# Patient Record
Sex: Male | Born: 1950 | Race: White | Hispanic: No | Marital: Married | Smoking: Never smoker
Health system: Southern US, Community
[De-identification: ages and names within clinical notes are randomized; demographics above are authoritative.]

## PROBLEM LIST (undated history)

## (undated) DIAGNOSIS — J45909 Unspecified asthma, uncomplicated: Secondary | ICD-10-CM

## (undated) DIAGNOSIS — M1990 Unspecified osteoarthritis, unspecified site: Secondary | ICD-10-CM

## (undated) DIAGNOSIS — K219 Gastro-esophageal reflux disease without esophagitis: Secondary | ICD-10-CM

## (undated) HISTORY — PX: JOINT REPLACEMENT: SHX530

## (undated) HISTORY — PX: KNEE CARTILAGE SURGERY: SHX688

---

## 2003-12-03 ENCOUNTER — Ambulatory Visit: Payer: Self-pay | Admitting: Internal Medicine

## 2004-04-13 ENCOUNTER — Ambulatory Visit: Payer: Self-pay | Admitting: Gastroenterology

## 2011-10-09 DIAGNOSIS — N138 Other obstructive and reflux uropathy: Secondary | ICD-10-CM | POA: Insufficient documentation

## 2011-10-09 DIAGNOSIS — N411 Chronic prostatitis: Secondary | ICD-10-CM | POA: Insufficient documentation

## 2014-01-06 ENCOUNTER — Ambulatory Visit: Payer: Self-pay | Admitting: General Practice

## 2014-01-06 LAB — URINALYSIS, COMPLETE
BACTERIA: NONE SEEN
BILIRUBIN, UR: NEGATIVE
BLOOD: NEGATIVE
Glucose,UR: NEGATIVE mg/dL (ref 0–75)
Ketone: NEGATIVE
Leukocyte Esterase: NEGATIVE
Nitrite: NEGATIVE
Ph: 6 (ref 4.5–8.0)
Protein: NEGATIVE
RBC,UR: <1 /HPF (ref 0–5)
Specific Gravity: 1.014 (ref 1.003–1.030)
WBC UR: <1 /HPF (ref 0–5)

## 2014-01-06 LAB — BASIC METABOLIC PANEL
Anion Gap: 5 — ABNORMAL LOW (ref 7–16)
BUN: 15 mg/dL (ref 7–18)
CHLORIDE: 105 mmol/L (ref 98–107)
CO2: 30 mmol/L (ref 21–32)
CREATININE: 0.94 mg/dL (ref 0.60–1.30)
Calcium, Total: 8.9 mg/dL (ref 8.5–10.1)
EGFR (African American): >60
EGFR (Non-African Amer.): >60
Glucose: 105 mg/dL — ABNORMAL HIGH (ref 65–99)
Osmolality: 281 (ref 275–301)
Potassium: 4.2 mmol/L (ref 3.5–5.1)
Sodium: 140 mmol/L (ref 136–145)

## 2014-01-06 LAB — CBC
HCT: 42.9 % (ref 40.0–52.0)
HGB: 14.4 g/dL (ref 13.0–18.0)
MCH: 30.2 pg (ref 26.0–34.0)
MCHC: 33.6 g/dL (ref 32.0–36.0)
MCV: 90 fL (ref 80–100)
PLATELETS: 215 10*3/uL (ref 150–440)
RBC: 4.77 10*6/uL (ref 4.40–5.90)
RDW: 13.1 % (ref 11.5–14.5)
WBC: 4.3 10*3/uL (ref 3.8–10.6)

## 2014-01-06 LAB — SEDIMENTATION RATE: Erythrocyte Sed Rate: 7 mm/hr (ref 0–20)

## 2014-01-06 LAB — MRSA PCR SCREENING

## 2014-01-06 LAB — PROTIME-INR
INR: 0.9
Prothrombin Time: 11.7 secs (ref 11.5–14.7)

## 2014-01-06 LAB — APTT: ACTIVATED PTT: 29.2 s (ref 23.6–35.9)

## 2014-01-07 LAB — URINE CULTURE

## 2014-01-18 ENCOUNTER — Inpatient Hospital Stay: Payer: Self-pay | Admitting: General Practice

## 2014-01-19 LAB — BASIC METABOLIC PANEL
ANION GAP: 6 — AB (ref 7–16)
BUN: 9 mg/dL (ref 7–18)
CALCIUM: 7.9 mg/dL — AB (ref 8.5–10.1)
CO2: 29 mmol/L (ref 21–32)
CREATININE: 0.95 mg/dL (ref 0.60–1.30)
Chloride: 102 mmol/L (ref 98–107)
EGFR (African American): >60
EGFR (Non-African Amer.): >60
GLUCOSE: 131 mg/dL — AB (ref 65–99)
Osmolality: 274 (ref 275–301)
Potassium: 3.5 mmol/L (ref 3.5–5.1)
SODIUM: 137 mmol/L (ref 136–145)

## 2014-01-19 LAB — HEMOGLOBIN: HGB: 11.6 g/dL — ABNORMAL LOW (ref 13.0–18.0)

## 2014-01-19 LAB — PLATELET COUNT: Platelet: 179 10*3/uL (ref 150–440)

## 2014-01-20 LAB — BASIC METABOLIC PANEL
Anion Gap: 5 — ABNORMAL LOW (ref 7–16)
BUN: 8 mg/dL (ref 7–18)
CO2: 29 mmol/L (ref 21–32)
Calcium, Total: 7.8 mg/dL — ABNORMAL LOW (ref 8.5–10.1)
Chloride: 104 mmol/L (ref 98–107)
Creatinine: 0.99 mg/dL (ref 0.60–1.30)
EGFR (African American): >60
EGFR (Non-African Amer.): >60
GLUCOSE: 134 mg/dL — AB (ref 65–99)
Osmolality: 276 (ref 275–301)
POTASSIUM: 3.8 mmol/L (ref 3.5–5.1)
Sodium: 138 mmol/L (ref 136–145)

## 2014-01-20 LAB — HEMOGLOBIN: HGB: 11 g/dL — ABNORMAL LOW (ref 13.0–18.0)

## 2014-01-20 LAB — PLATELET COUNT: PLATELETS: 174 10*3/uL (ref 150–440)

## 2014-02-02 DIAGNOSIS — Z96652 Presence of left artificial knee joint: Secondary | ICD-10-CM | POA: Insufficient documentation

## 2014-05-10 DIAGNOSIS — M1711 Unilateral primary osteoarthritis, right knee: Secondary | ICD-10-CM | POA: Insufficient documentation

## 2014-05-10 DIAGNOSIS — E785 Hyperlipidemia, unspecified: Secondary | ICD-10-CM | POA: Insufficient documentation

## 2014-05-10 DIAGNOSIS — K219 Gastro-esophageal reflux disease without esophagitis: Secondary | ICD-10-CM | POA: Insufficient documentation

## 2014-05-10 DIAGNOSIS — J45909 Unspecified asthma, uncomplicated: Secondary | ICD-10-CM | POA: Insufficient documentation

## 2014-05-22 NOTE — Op Note (Signed)
PATIENT NAME:  [name], [name] MR#:  [id number] DATE OF BIRTH:  [birth year]  DATE OF PROCEDURE:  01/18/2014  PREOPERATIVE DIAGNOSIS: Degenerative arthrosis of the left knee (primary).   POSTOPERATIVE DIAGNOSIS: Degenerative arthrosis of the left knee (primary).   PROCEDURE PERFORMED: Left total knee arthroplasty using computer-assisted navigation.   SURGEON: Francesco SorJames Hooten, MD   ASSISTANT: Van ClinesJon Wolfe, PA (required to maintain retraction throughout the procedure).   ANESTHESIA: Spinal.   ESTIMATED BLOOD LOSS: 50 mL.   FLUIDS REPLACED: Crystalloid 1700 mL.   TOURNIQUET TIME: 91 minutes.   DRAINS: 2 medium drains to reinfusion system.   SOFT TISSUE RELEASES: Anterior cruciate ligament, posterior cruciate ligament, deep medial collateral ligament, patellofemoral ligament.   IMPLANTS UTILIZED: DePuy PFC Sigma size 5 posterior stabilized femoral component (cemented), size 5 MBT tibial component (cemented), 38 mm 3 peg oval dome patella (cemented), and a 12.5 mm stabilized rotating platform polyethylene insert.   INDICATIONS FOR SURGERY: The patient is a 64 year old male who has been seen for complaints of progressive left knee pain. X-rays demonstrated severe degenerative changes in tricompartmental fashion with varus deformity. After discussion of the risks and benefits of surgical intervention, the patient expressed understanding of the risks and benefits, and agreed with plans for surgical intervention.   PROCEDURE IN DETAIL: The patient was brought into the operating room and, after adequate spinal anesthesia was achieved, a tourniquet was placed on the patient's upper left thigh. The patient's left knee and leg were cleaned and prepped with alcohol and DuraPrep, draped in the usual sterile fashion. A "timeout" was performed as per usual protocol. The left lower extremity was exsanguinated using an Esmarch, and the tourniquet was inflated to 300 mmHg. An anterior longitudinal incision was made  followed by a standard mid vastus approach. A large effusion was evacuated. The deep fibers of the medial collateral ligament were elevated in a subperiosteal fashion off the medial flare of the tibia so as to maintain a continuous soft tissue sleeve. The patella was subluxed laterally and the patellofemoral ligament was incised. Inspection of the knee demonstrated severe degenerative changes with full-thickness loss of articular cartilage to the medial compartment and significant degenerative changes to the lateral compartment and patellofemoral compartments. Prominent osteophytes were debrided using a rongeur. Anterior and posterior cruciate ligaments were excised. Two 4.0 mm Schanz pins were inserted into the femur and into the tibia for attachment of the ray of trackers used for computer-assisted navigation. Hip center was identified using circumduction technique. Distal landmarks were mapped using the computer. The distal femur and proximal tibia were mapped using the computer. Distal femoral cutting guide was positioned using computer-assisted navigation so as to achieve a 5 degree distal valgus cut. Cut was performed and verified using the computer. Distal femur was sized and it was felt that a size 5 femoral component was appropriate. A size 5 cutting guide was positioned and the anterior cut was performed and verified using the computer. This was followed by completion of the posterior and chamfer cuts. The cutting guide for the femoral box was positioned and femoral box cut was performed. Attention was then directed to the proximal tibia. Medial and lateral menisci were excised. The extramedullary tibial cutting guide was positioned using computer-assisted navigation so as to achieve a 0 degree varus valgus alignment, 0 degree posterior slope. A significant defect was noted to the medial compartment. Cut was performed and verified using the computer. The proximal tibia was sized and it was felt that a size  5 tibial tray was appropriate. Tibial and femoral trials were inserted followed by insertion of first a 10 and subsequently a 12.5 mm polyethylene insert. This allowed for excellent medial and lateral soft tissue balancing both in full extension and in flexion. Finally, the patella was cut and prepared so as to accommodate a 38 mm 3 peg oval dome patella. The patella component was positioned and it was placed through range of motion with good tracking of the patella.   Femoral trial was removed. Central post hole for the tibial component was reamed followed by insertion of a keel punch. Tibial trials were then removed. Cut surfaces of bone were irrigated with copious amounts of normal saline with antibiotic solution using pulsatile lavage then suctioned dry. Polymethylmethacrylate cement was prepared in the usual fashion using a vacuum mixer. Cement was applied to the cut surface of the proximal tibia as well as along the undersurface of a size 5 MBT tibial component. The tibial component was positioned and impacted into place. Excess cement was removed using Personal assistant. Cement was then applied to the cut surfaces of the femur as well as along the posterior flanges of a size 5 posterior stabilized femoral component. Femoral component was positioned and impacted into place. Excess cement was removed using Personal assistant. A 12.5 mm polyethylene trial was inserted and the knee was brought in full extension with steady axial compression applied. Finally, cement was applied to the backside of a 3 peg oval dome patella, and the patellar component was positioned and patellar clamp applied. Excess cement was removed using Personal assistant.   After adequate curing of cement, the tourniquet was deflated after a total tourniquet time of 91 minutes. Hemostasis was achieved using electrocautery. The knee was irrigated with copious amounts of normal saline with antibiotic solution using pulsatile lavage and then  suctioned dry. The knee was inspected for any residual cement debris. 20 mL of 1.3% Exparel and 40 mL of normal saline was injected along the posterior recess, medial and lateral gutters, and along the arthrotomy site. A 12.5 mm stabilized rotating platform polyethylene insert was inserted and the knee was placed through a range of motion with excellent patellar tracking appreciated and excellent medial and lateral soft tissue balancing noted. Two medium drains were placed in the wound bed and brought out through a separate stab incision to be attached to a reinfusion system. The medial parapatellar portion of the incision was reapproximated using interrupted sutures of #1 Vicryl. The subcutaneous tissue was approximated in layers using first #0 Vicryl followed by 2-0 Vicryl. Then, 30 mL of 0.25% Marcaine with epinephrine was injected into the subcutaneous tissue along the incision site. Skin was closed with skin staples. Sterile dressing was applied. The patient tolerated the procedure well. He was transported to the recovery room in stable condition.     ____________________________ Illene Labrador. Angie Fava., MD jph:TT D: 01/18/2014 13:02:11 ET T: 01/18/2014 14:31:45 ET JOB#: [id number]  cc: Fayrene Fearing P. Angie Fava., MD, <Dictator> Illene Labrador Angie Fava MD ELECTRONICALLY SIGNED 01/19/2014 19:49

## 2014-05-26 NOTE — Discharge Summary (Signed)
PATIENT NAME:  [name], [name] MR#:  [id number] DATE OF BIRTH:  04/15/1950  DATE OF ADMISSION:  01/18/2014 DATE OF DISCHARGE:  01/20/2014  ADMITTING DIAGNOSIS: Degenerative arthritis, left knee.   DISCHARGE DIAGNOSIS: Degenerative arthritis, left knee.   PROCEDURE PERFORMED: Left total knee arthroplasty with computer-assisted navigation.   SURGEON: Francesco SorJames Hooten, M.D.   ASSISTANT: Hamilton CapriJohn Wolfe, PA.   ANESTHESIA: Spinal.   ESTIMATED BLOOD LOSS: 50 mL.  FLUIDS REPLACED: Crystalloid 1700 mL.  TOURNIQUET TIME:  91 minutes.   DRAINS: Two medium drains to reinfusion system.   SOFT TISSUE RELEASES:  Anterior cruciate ligament, posterior cruciate ligament, deep medial collateral ligament and patellofemoral ligament.   IMPLANTS UTILIZED:  PFC Sigma size 5 posterior stabilized femoral component, size 5 MBT tibial component, 38 mm three peg oval dome patella, and a 12.5 mm stabilized rotating platform polyethylene insert.    HISTORY: The patient is a 64 year old male who has been here for complaints of progressive left knee pain. X-rays demonstrated severe degenerative changes in tricompartmental fashion with varus deformity. After discussion of the risks and benefits of surgical intervention, the patient expressed understanding of the risks and benefits and agreed with the plans for surgical intervention.   PHYSICAL EXAMINATION:  GENERAL: Well-developed, well-nourished male in no apparent distress. Normal affect. no antalgic component.  HEENT: Head normocephalic, atraumatic. Pupils equal, round, and reactive to light.  ABDOMEN: Soft, nontender, nondistended. Bowel sounds positive.  HEART: Regular rate and rhythm.  LUNGS: Clear to auscultation bilaterally. No wheezing, rales or rhonchi.   LEFT KNEE EXAMINATION:  Left knee shows the patient has no effusion, and no warmth or erythema. There is mild swelling. He is tender along the medial and lateral joint line. He has a slight varus deformity. He  is neurovascularly intact in the left lower extremity.   HOSPITAL COURSE: The patient was admitted to the hospital on 01/18/2014. He had surgery that same day and was brought to the orthopedic floor from the PACU in stable condition. On postop day one, the patient had an acute postoperative blood loss anemia with hemoglobin of 11.6. On postop day two, this trended down to 11. The patient was doing well. He was stable.  The remainder of the labs were stable. Vital signs are stable. He made excellent progress with physical therapy and by postop day two, he was stable and ready for discharge to home with home health PT.   CONDITION AT DISCHARGE: Stable.   DISCHARGE INSTRUCTIONS: The patient may gradually increase weight-bearing on the affected extremity. Elevate the affected foot and leg with the foot higher than the knee; elevate the heels off the bed. Use incentive spirometer every hour while awake. Encouraged cough and deep breathing. The patient may resume a regular diet as tolerated. Continue using Polar Care unit, maintain temperature between 40 and 50 degrees, do not get the dressing or bandage wet or dirty. [facility] if the dressing gets water under it. Leave the dressing on and call [facility] if any of the following occur: bright red blood from incision wound, fever above 101.5 degrees, redness, swelling, or drainage at the incision site. Call [facility] if you experience any increased leg pain, numbness or weakness in her legs or bowel or bladder symptoms. Home physical therapy has been arranged for the rehab program. Please [facility] if a therapist has not contacted you within 48 hours of your return home.   DISCHARGE MEDICATIONS: Please see list of discharge medications on discharge  instructions.    ____________________________ T. Cranston Neighbor, PA-C tcg:at D: 01/21/2014 08:16:10 ET T: 01/21/2014 10:16:48 ET JOB#: [id number]  cc: T. Cranston Neighbor, PA-C,  <Dictator> Evon Slack [place] ELECTRONICALLY SIGNED 02/09/2014 8:33

## 2015-04-29 ENCOUNTER — Other Ambulatory Visit
Admission: RE | Admit: 2015-04-29 | Discharge: 2015-04-29 | Disposition: A | Discharge status: Home / Self Care | Payer: Medicare Other | Source: Ambulatory Visit | Attending: Internal Medicine | Admitting: Internal Medicine

## 2015-04-29 DIAGNOSIS — R197 Diarrhea, unspecified: Secondary | ICD-10-CM | POA: Insufficient documentation

## 2015-04-29 LAB — C DIFFICILE QUICK SCREEN W PCR REFLEX
C DIFFICLE (CDIFF) ANTIGEN: NEGATIVE
C Diff interpretation: NEGATIVE
C Diff toxin: NEGATIVE

## 2016-11-20 DIAGNOSIS — R339 Retention of urine, unspecified: Secondary | ICD-10-CM | POA: Insufficient documentation

## 2017-02-08 DIAGNOSIS — G629 Polyneuropathy, unspecified: Secondary | ICD-10-CM | POA: Insufficient documentation

## 2017-02-08 DIAGNOSIS — G5603 Carpal tunnel syndrome, bilateral upper limbs: Secondary | ICD-10-CM | POA: Insufficient documentation

## 2017-03-06 DIAGNOSIS — M7521 Bicipital tendinitis, right shoulder: Secondary | ICD-10-CM | POA: Insufficient documentation

## 2017-03-06 DIAGNOSIS — G5621 Lesion of ulnar nerve, right upper limb: Secondary | ICD-10-CM | POA: Insufficient documentation

## 2017-03-26 ENCOUNTER — Other Ambulatory Visit: Payer: Self-pay | Admitting: Surgery

## 2017-03-26 DIAGNOSIS — M7521 Bicipital tendinitis, right shoulder: Secondary | ICD-10-CM

## 2017-04-02 ENCOUNTER — Other Ambulatory Visit: Payer: Self-pay | Admitting: Surgery

## 2017-04-02 ENCOUNTER — Other Ambulatory Visit: Payer: Self-pay | Admitting: Orthopedic Surgery

## 2017-04-02 DIAGNOSIS — Z1389 Encounter for screening for other disorder: Secondary | ICD-10-CM

## 2017-04-02 DIAGNOSIS — M795 Residual foreign body in soft tissue: Secondary | ICD-10-CM

## 2017-04-03 ENCOUNTER — Ambulatory Visit
Admission: RE | Admit: 2017-04-03 | Discharge: 2017-04-03 | Disposition: A | Discharge status: Home / Self Care | Payer: Medicare Other | Source: Ambulatory Visit | Attending: Surgery | Admitting: Surgery

## 2017-04-03 DIAGNOSIS — M7521 Bicipital tendinitis, right shoulder: Secondary | ICD-10-CM | POA: Insufficient documentation

## 2017-04-03 DIAGNOSIS — M19011 Primary osteoarthritis, right shoulder: Secondary | ICD-10-CM | POA: Insufficient documentation

## 2017-04-03 DIAGNOSIS — Z1389 Encounter for screening for other disorder: Secondary | ICD-10-CM

## 2017-04-03 DIAGNOSIS — M795 Residual foreign body in soft tissue: Secondary | ICD-10-CM | POA: Diagnosis not present

## 2017-04-18 DIAGNOSIS — M7581 Other shoulder lesions, right shoulder: Secondary | ICD-10-CM | POA: Insufficient documentation

## 2019-01-08 NOTE — H&P (Signed)
ORTHOPAEDIC HISTORY & PHYSICAL  Progress Notes by Gwenlyn Fudge, PA at 01/08/2019 8:45 AM  [place] [facility] Chief Complaint:       Chief Complaint  Patient presents with  . Knee Pain    H & P RIGHT KNEE    History of Present Illness:    [name] is a 68 y.o. male that presents to clinic today for his preoperative history and evaluation.  Patient presents unaccompanied. The patient is scheduled to undergo a right total knee arthroplasty on 01/21/19 by Dr. Marry Guan. His pain began many years ago.  The pain is located throughout the knee.  He reports associated increase in pain with weightbearing as well as some giving way of the knee.  He states that the pain in the right knee has been worse since he fell 1 year ago while getting out of his tractor and that his knee flexed further than it normally would.  He states his range of motion has actually improved since the fall, but that the pain has as well.  He denies associated numbness or tingling or locking of the knee.  The patient's symptoms have progressed to the point that they decrease his quality of life. The patient has previously undergone conservative treatment including NSAIDS and injections to the knee without adequate control of his symptoms.  Of note, patient is status post left total knee arthroplasty by Dr. Marry Guan on 01/18/2014.  Patient states the left knee is doing well.  Patient denies history of blood clots or significant cardiac history.  Past Medical, Surgical, Family, Social History, Allergies, Medications:   Past Medical History:      Past Medical History:  Diagnosis Date  . Asthma without status asthmaticus, unspecified    mild  . Hay fever   . Hypercholesterolemia    diet  . Osteoarthritis    knees    Past Surgical History:       Past Surgical History:  Procedure Laterality Date  . CARTILAGE SURGERY ON BOTH KNEES Bilateral   .  COLONOSCOPY  04/14/2018   Inflammatory Polyp/Repeat 73yrs/TKT  . EGD  04/14/2018   Fundic Gland Polyp/No Repeat/TKT  . Left total knee arthroplasty using computer-assisted navigation  01/18/2014   Dr Marry Guan    Current Medications:  Current Medications        Current Outpatient Medications  Medication Sig Dispense Refill  . albuterol 90 mcg/actuation inhaler Inhale 2 inhalations into the lungs every 4 (four) hours as needed for Wheezing 1 Inhaler 11  . amoxicillin (AMOXIL) 500 MG capsule TK 4 CS PO 1 HOUR PRIOR TO PROCEDURE    . cannabidiol, CBD, (CANNABIDIOL ORAL) Take 1 tablet by mouth once daily       . esomeprazole (NEXIUM) 40 MG DR capsule TAKE ONE CAPSULE BY MOUTH ONCE DAILY 90 capsule 3  . fluticasone propion-salmeteroL (ADVAIR DISKUS) 250-50 mcg/dose diskus inhaler Inhale 1 inhalation into the lungs every 12 (twelve) hours 180 each 3  . fluticasone propionate (FLONASE) 50 mcg/actuation nasal spray SPRAY TWICE IN EACH NOSTRIL BID    . Lacto.acidophilus-Bif.animalis 32 billion cell Cap Take 1 capsule by mouth once daily       . Lactobacillus acidophilus (PROBIOTIC ORAL) Take 1 capsule by mouth once daily    . meloxicam (MOBIC) 7.5 MG tablet TAKE 1 TABLET BY MOUTH EVERY DAY 90 tablet 1  . montelukast (SINGULAIR) 10 mg tablet TAKE 1 TABLET BY MOUTH EVERY DAY 90 tablet 1  .  tamsulosin (FLOMAX) 0.4 mg capsule Take 1 capsule (0.4 mg total) by mouth once daily 89 capsule 3  . TURMERIC ORAL Take 500 mg by mouth once daily     No current facility-administered medications for this visit.       Allergies: No Known Allergies  Social History:  Social History  Social History        Socioeconomic History  . Marital status: Married    Spouse name: Eunice BlaseDebbie  . Number of children: 2  . Years of education: 3812  . Highest education level: High school graduate  Occupational History  . Occupation: Full-time- Visual merchandiserarmer  Social Needs  . Financial resource strain: Not  on file  . Food insecurity    Worry: Not on file    Inability: Not on file  . Transportation needs    Medical: Not on file    Non-medical: Not on file  Tobacco Use  . Smoking status: Never Smoker  . Smokeless tobacco: Never Used  Substance and Sexual Activity  . Alcohol use: No    Alcohol/week: 0.0 standard drinks  . Drug use: No  . Sexual activity: Yes    Partners: Female  Lifestyle  . Physical activity    Days per week: Not on file    Minutes per session: Not on file  . Stress: Not on file  Relationships  . Social Musicianconnections    Talks on phone: Not on file    Gets together: Not on file    Attends religious service: Not on file    Active member of club or organization: Not on file    Attends meetings of clubs or organizations: Not on file    Relationship status: Not on file  Other Topics Concern  . Not on file  Social History Narrative  . Not on file      Family History:       Family History  Problem Relation Age of Onset  . Hepatitis Father   . Cirrhosis Father        no alcohol was involved  . Diabetes type II Sister   . Diabetes Paternal Grandmother   . Diabetes Paternal Grandfather     Review of Systems:   A 10+ ROS was performed, reviewed, and the pertinent orthopaedic findings are documented in the HPI.    Physical Examination:   BP 128/76   Ht 172.7 cm (5\' 8" )   Wt 76.6 kg (168 lb 12.8 oz)   BMI 25.67 kg/m   Patient is a well-developed, well-nourished male in no acute distress. Patient has normal mood and affect. Patient is alert and oriented to person, place, and time.   HEENT: Atraumatic, normocephalic.  Pupils equal and reactive to light.  Extraocular motion intact.  Noninjected sclera.  Cardiovascular: Regular rate and rhythm, with no murmurs, rubs, or gallops.  Distal pulses palpable.  Respiratory: Lungs clear to auscultation bilaterally.   RightKnee: Soft tissue  swelling:minimal Effusion:none Erythema:none Crepitance:mild Tenderness:medial Alignment:relative varus Mediolateral laxity:medial pseudolaxity Posterior ZOX:WRUEAVWUsag:negative Patellar tracking:Good tracking without evidence of subluxation or tilt Atrophy:No significantatrophy.  Quadriceps tone was good. Range of motion:0/5/115 degrees  Sensation intact over the saphenous, lateral sural cutaneous, superficial fibular, and deep fibular nerve distributions.  Tests Performed/Reviewed:  X-rays  Anteroposterior, lateral, and sunrise views of the right knee were obtained.  Images reveal complete loss of both medial and lateral compartment joint spaces with deformation of the plateau as well as of the medial and lateral condyles.  Subchondral sclerosis noted  laterally.  Significant osteophyte formation noted.  Sunrise view reveals loss of patellofemoral joint space with osteophyte formation.  Sunrise view also reveals presence of a left total knee arthroplasty.  Radiographs were ordered and interpreted personally by me.  Impression:     ICD-10-CM  1. Primary osteoarthritis of right knee  M17.11  2. Presence of left artificial knee joint  [id number]   Plan:   The patient has end-stage degenerative changes of the right knee.  It was explained to the patient that the condition is progressive in nature.  Having failed conservative treatment, the patient has elected to proceed with a total joint arthroplasty.  The patient will undergo a total joint arthroplasty with Dr. Ernest Pine.  The risks of surgery, including blood clot and infection, were discussed with the patient.  Measures to reduce these risks, including the use of anticoagulation, perioperative antibiotics, and early ambulation were discussed.  The  importance of postoperative physical therapy was discussed with the patient. The patient elects to proceed with surgery. The patient is instructed to stop all blood thinners prior to surgery.  The patient is instructed to call the hospital the day before surgery to learn of the proper arrival time.    Contact our office with any questions or concerns.  Follow up as indicated, or sooner should any new problems arise, if conditions worsen, or if they are otherwise concerned.   Michelene Gardener, PA [facility] [address] [place], [place] [address] Phone: [phone]  This note was generated in part with voice recognition software and I apologize for any typographical errors that were not detected and corrected.     Electronically signed by Michelene Gardener, PA on 01/08/2019 10:17 AM

## 2019-01-09 ENCOUNTER — Encounter
Admission: RE | Admit: 2019-01-09 | Discharge: 2019-01-09 | Disposition: A | Discharge status: Home / Self Care | Payer: Medicare Other | Source: Ambulatory Visit | Attending: Orthopedic Surgery | Admitting: Orthopedic Surgery

## 2019-01-09 ENCOUNTER — Other Ambulatory Visit: Payer: Self-pay

## 2019-01-09 HISTORY — DX: Unspecified osteoarthritis, unspecified site: M19.90

## 2019-01-09 HISTORY — DX: Unspecified asthma, uncomplicated: J45.909

## 2019-01-09 HISTORY — DX: Gastro-esophageal reflux disease without esophagitis: K21.9

## 2019-01-09 NOTE — Patient Instructions (Signed)
Your procedure is scheduled on: 01/21/19 Report to [place]. To find out your arrival time please call [phone] between 1PM - 3PM on 01/20/19.  Remember: Instructions that are not followed completely may result in serious medical risk, up to and including death, or upon the discretion of your surgeon and anesthesiologist your surgery may need to be rescheduled.     _X__ 1. Do not eat food after midnight the night before your procedure.                 No gum chewing or hard candies. You may drink clear liquids up to 2 hours                 before you are scheduled to arrive for your surgery- DO not drink clear                 liquids within 2 hours of the start of your surgery.                 Clear Liquids include:  water, apple juice without pulp, clear carbohydrate                 drink such as Clearfast or Gatorade, Black Coffee or Tea (Do not add                 anything to coffee or tea). Diabetics water only  __X__2.  On the morning of surgery brush your teeth with toothpaste and water, you                 may rinse your mouth with mouthwash if you wish.  Do not swallow any              toothpaste of mouthwash.     _X__ 3.  No Alcohol for 24 hours before or after surgery.   _X__ 4.  Do Not Smoke or use e-cigarettes For 24 Hours Prior to Your Surgery.                 Do not use any chewable tobacco products for at least 6 hours prior to                 surgery.  ____  5.  Bring all medications with you on the day of surgery if instructed.   __X__  6.  Notify your doctor if there is any change in your medical condition      (cold, fever, infections).     Do not wear jewelry, make-up, hairpins, clips or nail polish. Do not wear lotions, powders, or perfumes.  Do not shave 48 hours prior to surgery. Men may shave face and neck. Do not bring valuables to the hospital.    [facility] is not responsible for any belongings  or valuables.  Contacts, dentures/partials or body piercings may not be worn into surgery. Bring a case for your contacts, glasses or hearing aids, a denture cup will be supplied. Leave your suitcase in the car. After surgery it may be brought to your room. For patients admitted to the hospital, discharge time is determined by your treatment team.   Patients discharged the day of surgery will not be allowed to drive home.   Please read over the following fact sheets that you were given:   MRSA Information  __X__ Take these medicines the morning of surgery with A SIP OF WATER:  1. esomeprazole (NEXIUM) 40 MG capsule  2. montelukast (SINGULAIR) 10 MG tablet  3. tamsulosin (FLOMAX) 0.4 MG CAPS capsule  4.  5.  6.  ____ Fleet Enema (as directed)   __X__ Use CHG Soap/SAGE wipes as directed  __X__ Use inhalers on the day of surgery  ____ Stop metformin/Janumet/Farxiga 2 days prior to surgery    ____ Take 1/2 of usual insulin dose the night before surgery. No insulin the morning          of surgery.   ____ Stop Blood Thinners Coumadin/Plavix/Xarelto/Pleta/Pradaxa/Eliquis/Effient/Aspirin  on   Or contact your Surgeon, Cardiologist or Medical Doctor regarding  ability to stop your blood thinners  __X__ Stop Anti-inflammatories 7 days before surgery such as Advil, Ibuprofen, Motrin,  BC or Goodies Powder, Naprosyn, Naproxen, Aleve, Aspirin   STOP MELOXICAM  __X__ Stop all herbal supplements, fish oil or vitamin E until after surgery. STOP TURMERIC   ____ Bring C-Pap to the hospital.

## 2019-01-12 ENCOUNTER — Other Ambulatory Visit: Payer: Self-pay

## 2019-01-12 ENCOUNTER — Encounter
Admission: RE | Admit: 2019-01-12 | Discharge: 2019-01-12 | Disposition: A | Discharge status: Home / Self Care | Payer: Medicare Other | Source: Ambulatory Visit | Attending: Orthopedic Surgery | Admitting: Orthopedic Surgery

## 2019-01-12 DIAGNOSIS — R001 Bradycardia, unspecified: Secondary | ICD-10-CM | POA: Insufficient documentation

## 2019-01-12 DIAGNOSIS — Z01812 Encounter for preprocedural laboratory examination: Secondary | ICD-10-CM | POA: Diagnosis not present

## 2019-01-12 LAB — CBC
HCT: 40.8 % (ref 39.0–52.0)
Hemoglobin: 14.3 g/dL (ref 13.0–17.0)
MCH: 29.5 pg (ref 26.0–34.0)
MCHC: 35 g/dL (ref 30.0–36.0)
MCV: 84.3 fL (ref 80.0–100.0)
Platelets: 221 10*3/uL (ref 150–400)
RBC: 4.84 MIL/uL (ref 4.22–5.81)
RDW: 12.4 % (ref 11.5–15.5)
WBC: 5.3 10*3/uL (ref 4.0–10.5)
nRBC: 0 % (ref 0.0–0.2)

## 2019-01-12 LAB — COMPREHENSIVE METABOLIC PANEL
ALT: 24 U/L (ref 0–44)
AST: 23 U/L (ref 15–41)
Albumin: 4.1 g/dL (ref 3.5–5.0)
Alkaline Phosphatase: 75 U/L (ref 38–126)
Anion gap: 7 (ref 5–15)
BUN: 19 mg/dL (ref 8–23)
CO2: 26 mmol/L (ref 22–32)
Calcium: 8.9 mg/dL (ref 8.9–10.3)
Chloride: 106 mmol/L (ref 98–111)
Creatinine, Ser: 0.9 mg/dL (ref 0.61–1.24)
GFR calc Af Amer: >60 mL/min (ref >60)
GFR calc non Af Amer: >60 mL/min (ref >60)
Glucose, Bld: 111 mg/dL — ABNORMAL HIGH (ref 70–99)
Potassium: 4 mmol/L (ref 3.5–5.1)
Sodium: 139 mmol/L (ref 135–145)
Total Bilirubin: 0.9 mg/dL (ref 0.3–1.2)
Total Protein: 7 g/dL (ref 6.5–8.1)

## 2019-01-12 LAB — URINALYSIS, ROUTINE W REFLEX MICROSCOPIC
Bacteria, UA: NONE SEEN
Bilirubin Urine: NEGATIVE
Glucose, UA: NEGATIVE mg/dL
Hgb urine dipstick: NEGATIVE
Ketones, ur: NEGATIVE mg/dL
Leukocytes,Ua: NEGATIVE
Nitrite: NEGATIVE
Protein, ur: NEGATIVE mg/dL
Specific Gravity, Urine: 1.015 (ref 1.005–1.030)
pH: 7 (ref 5.0–8.0)

## 2019-01-12 LAB — PROTIME-INR
INR: 0.9 (ref 0.8–1.2)
Prothrombin Time: 12.4 seconds (ref 11.4–15.2)

## 2019-01-12 LAB — APTT: aPTT: 31 seconds (ref 24–36)

## 2019-01-12 LAB — TYPE AND SCREEN
ABO/RH(D): A POS
Antibody Screen: NEGATIVE

## 2019-01-12 LAB — SURGICAL PCR SCREEN
MRSA, PCR: NEGATIVE
Staphylococcus aureus: NEGATIVE

## 2019-01-12 LAB — SEDIMENTATION RATE: Sed Rate: 5 mm/hr (ref 0–20)

## 2019-01-12 LAB — C-REACTIVE PROTEIN: CRP: 0.5 mg/dL (ref <1.0)

## 2019-01-13 LAB — URINE CULTURE
Culture: NO GROWTH
Special Requests: NORMAL

## 2019-01-16 ENCOUNTER — Other Ambulatory Visit
Admission: RE | Admit: 2019-01-16 | Discharge: 2019-01-16 | Disposition: A | Discharge status: Home / Self Care | Payer: Medicare Other | Source: Ambulatory Visit | Attending: Orthopedic Surgery | Admitting: Orthopedic Surgery

## 2019-01-16 DIAGNOSIS — Z01812 Encounter for preprocedural laboratory examination: Secondary | ICD-10-CM | POA: Insufficient documentation

## 2019-01-16 DIAGNOSIS — Z20828 Contact with and (suspected) exposure to other viral communicable diseases: Secondary | ICD-10-CM | POA: Insufficient documentation

## 2019-01-16 LAB — SARS CORONAVIRUS 2 (TAT 6-24 HRS): SARS Coronavirus 2: NEGATIVE

## 2019-01-20 ENCOUNTER — Encounter: Payer: Self-pay | Admitting: Orthopedic Surgery

## 2019-01-20 MED ORDER — TRANEXAMIC ACID-NACL 1000-0.7 MG/100ML-% IV SOLN
1000.0000 mg | INTRAVENOUS | Status: AC
  Administered 2019-01-21: 1000 mg via INTRAVENOUS

## 2019-01-21 ENCOUNTER — Ambulatory Visit
Admission: RE | Admit: 2019-01-21 | Discharge: 2019-01-22 | Disposition: A | Discharge status: Home / Self Care | Payer: Medicare Other | Attending: Orthopedic Surgery | Admitting: Orthopedic Surgery

## 2019-01-21 ENCOUNTER — Ambulatory Visit: Payer: Medicare Other | Admitting: Anesthesiology

## 2019-01-21 ENCOUNTER — Other Ambulatory Visit: Payer: Self-pay

## 2019-01-21 ENCOUNTER — Encounter: Payer: Self-pay | Admitting: Orthopedic Surgery

## 2019-01-21 ENCOUNTER — Ambulatory Visit: Payer: Medicare Other

## 2019-01-21 ENCOUNTER — Encounter
Admission: RE | Disposition: A | Discharge status: Home / Self Care | Payer: Self-pay | Source: Home / Self Care | Attending: Orthopedic Surgery

## 2019-01-21 DIAGNOSIS — Z96652 Presence of left artificial knee joint: Secondary | ICD-10-CM | POA: Diagnosis not present

## 2019-01-21 DIAGNOSIS — Z79899 Other long term (current) drug therapy: Secondary | ICD-10-CM | POA: Insufficient documentation

## 2019-01-21 DIAGNOSIS — G629 Polyneuropathy, unspecified: Secondary | ICD-10-CM | POA: Diagnosis not present

## 2019-01-21 DIAGNOSIS — J45909 Unspecified asthma, uncomplicated: Secondary | ICD-10-CM | POA: Diagnosis not present

## 2019-01-21 DIAGNOSIS — Z791 Long term (current) use of non-steroidal anti-inflammatories (NSAID): Secondary | ICD-10-CM | POA: Insufficient documentation

## 2019-01-21 DIAGNOSIS — E7800 Pure hypercholesterolemia, unspecified: Secondary | ICD-10-CM | POA: Diagnosis not present

## 2019-01-21 DIAGNOSIS — G5603 Carpal tunnel syndrome, bilateral upper limbs: Secondary | ICD-10-CM | POA: Insufficient documentation

## 2019-01-21 DIAGNOSIS — K219 Gastro-esophageal reflux disease without esophagitis: Secondary | ICD-10-CM | POA: Diagnosis not present

## 2019-01-21 DIAGNOSIS — M6281 Muscle weakness (generalized): Secondary | ICD-10-CM | POA: Insufficient documentation

## 2019-01-21 DIAGNOSIS — E785 Hyperlipidemia, unspecified: Secondary | ICD-10-CM | POA: Diagnosis not present

## 2019-01-21 DIAGNOSIS — R2689 Other abnormalities of gait and mobility: Secondary | ICD-10-CM | POA: Insufficient documentation

## 2019-01-21 DIAGNOSIS — Z7951 Long term (current) use of inhaled steroids: Secondary | ICD-10-CM | POA: Insufficient documentation

## 2019-01-21 DIAGNOSIS — Z96651 Presence of right artificial knee joint: Secondary | ICD-10-CM

## 2019-01-21 DIAGNOSIS — M1711 Unilateral primary osteoarthritis, right knee: Secondary | ICD-10-CM | POA: Insufficient documentation

## 2019-01-21 DIAGNOSIS — Z96659 Presence of unspecified artificial knee joint: Secondary | ICD-10-CM

## 2019-01-21 HISTORY — PX: KNEE ARTHROPLASTY: SHX992

## 2019-01-21 LAB — ABO/RH: ABO/RH(D): A POS

## 2019-01-21 SURGERY — ARTHROPLASTY, KNEE, TOTAL, USING IMAGELESS COMPUTER-ASSISTED NAVIGATION
Anesthesia: Spinal | Site: Knee | Laterality: Right

## 2019-01-21 MED ORDER — GABAPENTIN 300 MG PO CAPS
ORAL_CAPSULE | ORAL | Status: AC
  Filled 2019-01-21: qty 1

## 2019-01-21 MED ORDER — CELECOXIB 200 MG PO CAPS: 400.0000 mg | ORAL_CAPSULE | Freq: Once | ORAL | Status: AC

## 2019-01-21 MED ORDER — FLUTICASONE PROPIONATE 50 MCG/ACT NA SUSP
2.0000 | Freq: Every day | NASAL | Status: DC | PRN
  Filled 2019-01-21: qty 16

## 2019-01-21 MED ORDER — MOMETASONE FURO-FORMOTEROL FUM 200-5 MCG/ACT IN AERO
2.0000 | INHALATION_SPRAY | Freq: Two times a day (BID) | RESPIRATORY_TRACT | Status: DC
  Filled 2019-01-21: qty 8.8

## 2019-01-21 MED ORDER — BUPIVACAINE LIPOSOME 1.3 % IJ SUSP
INTRAMUSCULAR | Status: AC
  Filled 2019-01-21: qty 20

## 2019-01-21 MED ORDER — NEOMYCIN-POLYMYXIN B GU 40-200000 IR SOLN
Status: AC
  Filled 2019-01-21: qty 20

## 2019-01-21 MED ORDER — MENTHOL 3 MG MT LOZG
1.0000 | LOZENGE | OROMUCOSAL | Status: DC | PRN
  Filled 2019-01-21: qty 9

## 2019-01-21 MED ORDER — SENNOSIDES-DOCUSATE SODIUM 8.6-50 MG PO TABS
1.0000 | ORAL_TABLET | Freq: Two times a day (BID) | ORAL | Status: DC
  Administered 2019-01-21 – 2019-01-22 (×3): 1 via ORAL
  Filled 2019-01-21 (×3): qty 1

## 2019-01-21 MED ORDER — PROPOFOL 500 MG/50ML IV EMUL
INTRAVENOUS | Status: AC
  Filled 2019-01-21: qty 50

## 2019-01-21 MED ORDER — TAMSULOSIN HCL 0.4 MG PO CAPS
0.4000 mg | ORAL_CAPSULE | Freq: Every day | ORAL | Status: DC
  Administered 2019-01-22: 0.4 mg via ORAL
  Filled 2019-01-21 (×2): qty 1

## 2019-01-21 MED ORDER — TRANEXAMIC ACID-NACL 1000-0.7 MG/100ML-% IV SOLN: 1000.0000 mg | Freq: Once | INTRAVENOUS | Status: DC

## 2019-01-21 MED ORDER — SODIUM CHLORIDE 0.9 % IV SOLN
INTRAVENOUS | Status: DC | PRN
  Administered 2019-01-21: 20 ug/min via INTRAVENOUS

## 2019-01-21 MED ORDER — FENTANYL CITRATE (PF) 100 MCG/2ML IJ SOLN: 25.0000 ug | INTRAMUSCULAR | Status: DC | PRN

## 2019-01-21 MED ORDER — BUPIVACAINE HCL (PF) 0.25 % IJ SOLN
INTRAMUSCULAR | Status: DC | PRN
  Administered 2019-01-21: 60 mL

## 2019-01-21 MED ORDER — EPHEDRINE SULFATE 50 MG/ML IJ SOLN
INTRAMUSCULAR | Status: AC
  Filled 2019-01-21: qty 1

## 2019-01-21 MED ORDER — LIDOCAINE HCL (PF) 2 % IJ SOLN
INTRAMUSCULAR | Status: AC
  Filled 2019-01-21: qty 10

## 2019-01-21 MED ORDER — METOCLOPRAMIDE HCL 5 MG/ML IJ SOLN: 5.0000 mg | Freq: Three times a day (TID) | INTRAMUSCULAR | Status: DC | PRN

## 2019-01-21 MED ORDER — TRANEXAMIC ACID-NACL 1000-0.7 MG/100ML-% IV SOLN
INTRAVENOUS | Status: AC
  Filled 2019-01-21: qty 100

## 2019-01-21 MED ORDER — ALUM & MAG HYDROXIDE-SIMETH 200-200-20 MG/5ML PO SUSP: 30.0000 mL | ORAL | Status: DC | PRN

## 2019-01-21 MED ORDER — NEOMYCIN-POLYMYXIN B GU 40-200000 IR SOLN
Status: DC | PRN
  Administered 2019-01-21: 14 mL

## 2019-01-21 MED ORDER — BISACODYL 10 MG RE SUPP
10.0000 mg | Freq: Every day | RECTAL | Status: DC | PRN
  Filled 2019-01-21: qty 1

## 2019-01-21 MED ORDER — METOCLOPRAMIDE HCL 10 MG PO TABS
10.0000 mg | ORAL_TABLET | Freq: Three times a day (TID) | ORAL | Status: DC
  Administered 2019-01-21 – 2019-01-22 (×3): 10 mg via ORAL
  Filled 2019-01-21 (×3): qty 1

## 2019-01-21 MED ORDER — SODIUM CHLORIDE 0.9 % IV BOLUS
500.0000 mL | Freq: Once | INTRAVENOUS | Status: AC
  Administered 2019-01-21: 500 mL via INTRAVENOUS

## 2019-01-21 MED ORDER — CEFAZOLIN SODIUM-DEXTROSE 2-4 GM/100ML-% IV SOLN
INTRAVENOUS | Status: AC
  Administered 2019-01-21: 2 g via INTRAVENOUS
  Filled 2019-01-21: qty 100

## 2019-01-21 MED ORDER — BUPIVACAINE HCL (PF) 0.5 % IJ SOLN
INTRAMUSCULAR | Status: AC
  Filled 2019-01-21: qty 10

## 2019-01-21 MED ORDER — OXYCODONE HCL 5 MG PO TABS
ORAL_TABLET | ORAL | Status: AC
  Administered 2019-01-21: 10 mg via ORAL
  Filled 2019-01-21: qty 2

## 2019-01-21 MED ORDER — MONTELUKAST SODIUM 10 MG PO TABS
10.0000 mg | ORAL_TABLET | Freq: Every day | ORAL | Status: DC
  Administered 2019-01-22: 10 mg via ORAL
  Filled 2019-01-21 (×2): qty 1

## 2019-01-21 MED ORDER — PROPOFOL 10 MG/ML IV BOLUS
INTRAVENOUS | Status: AC
  Filled 2019-01-21: qty 20

## 2019-01-21 MED ORDER — ENSURE PRE-SURGERY PO LIQD
296.0000 mL | Freq: Once | ORAL | Status: AC
  Administered 2019-01-21: 296 mL via ORAL
  Filled 2019-01-21: qty 296

## 2019-01-21 MED ORDER — ACETAMINOPHEN 10 MG/ML IV SOLN
INTRAVENOUS | Status: DC | PRN
  Administered 2019-01-21: 1000 mg via INTRAVENOUS

## 2019-01-21 MED ORDER — ONDANSETRON HCL 4 MG PO TABS: 4.0000 mg | ORAL_TABLET | Freq: Four times a day (QID) | ORAL | Status: DC | PRN

## 2019-01-21 MED ORDER — PROPOFOL 500 MG/50ML IV EMUL
INTRAVENOUS | Status: DC | PRN
  Administered 2019-01-21: 75 ug/kg/min via INTRAVENOUS

## 2019-01-21 MED ORDER — PROPOFOL 10 MG/ML IV BOLUS
INTRAVENOUS | Status: DC | PRN
  Administered 2019-01-21: 10 mg via INTRAVENOUS
  Administered 2019-01-21: 20 mg via INTRAVENOUS

## 2019-01-21 MED ORDER — SODIUM CHLORIDE 0.9 % IV SOLN
INTRAVENOUS | Status: DC | PRN
  Administered 2019-01-21: 60 mL

## 2019-01-21 MED ORDER — PHENYLEPHRINE HCL (PRESSORS) 10 MG/ML IV SOLN
INTRAVENOUS | Status: DC | PRN
  Administered 2019-01-21 (×3): 100 ug via INTRAVENOUS
  Administered 2019-01-21: 200 ug via INTRAVENOUS
  Administered 2019-01-21: 100 ug via INTRAVENOUS

## 2019-01-21 MED ORDER — DEXAMETHASONE SODIUM PHOSPHATE 10 MG/ML IJ SOLN
INTRAMUSCULAR | Status: AC
  Administered 2019-01-21: 8 mg via INTRAVENOUS
  Filled 2019-01-21: qty 1

## 2019-01-21 MED ORDER — RISAQUAD PO CAPS
1.0000 | ORAL_CAPSULE | Freq: Every day | ORAL | Status: DC
  Administered 2019-01-22: 1 via ORAL
  Filled 2019-01-21: qty 1

## 2019-01-21 MED ORDER — BUPIVACAINE HCL (PF) 0.25 % IJ SOLN
INTRAMUSCULAR | Status: AC
  Filled 2019-01-21: qty 60

## 2019-01-21 MED ORDER — EPHEDRINE SULFATE 50 MG/ML IJ SOLN
INTRAMUSCULAR | Status: DC | PRN
  Administered 2019-01-21 (×3): 5 mg via INTRAVENOUS

## 2019-01-21 MED ORDER — SODIUM CHLORIDE 0.9 % IV SOLN: INTRAVENOUS | Status: DC

## 2019-01-21 MED ORDER — ACETAMINOPHEN 10 MG/ML IV SOLN
1000.0000 mg | Freq: Four times a day (QID) | INTRAVENOUS | Status: DC
  Administered 2019-01-22 (×2): 1000 mg via INTRAVENOUS
  Filled 2019-01-21 (×2): qty 100

## 2019-01-21 MED ORDER — OXYCODONE HCL 5 MG PO TABS: 5.0000 mg | ORAL_TABLET | ORAL | 0 refills | Status: DC | PRN

## 2019-01-21 MED ORDER — SUCCINYLCHOLINE CHLORIDE 20 MG/ML IJ SOLN
INTRAMUSCULAR | Status: AC
  Filled 2019-01-21: qty 1

## 2019-01-21 MED ORDER — DIPHENHYDRAMINE HCL 12.5 MG/5ML PO ELIX
12.5000 mg | ORAL_SOLUTION | ORAL | Status: DC | PRN
  Filled 2019-01-21: qty 10

## 2019-01-21 MED ORDER — BUPIVACAINE HCL (PF) 0.5 % IJ SOLN
INTRAMUSCULAR | Status: DC | PRN
  Administered 2019-01-21: 2 mL

## 2019-01-21 MED ORDER — HYDROMORPHONE HCL 1 MG/ML IJ SOLN: 0.5000 mg | INTRAMUSCULAR | Status: DC | PRN

## 2019-01-21 MED ORDER — CHLORHEXIDINE GLUCONATE 4 % EX LIQD
60.0000 mL | Freq: Once | CUTANEOUS | Status: AC
  Administered 2019-01-21: 4 via TOPICAL

## 2019-01-21 MED ORDER — LIDOCAINE HCL (PF) 2 % IJ SOLN
INTRAMUSCULAR | Status: DC | PRN
  Administered 2019-01-21 (×2): 100 mg via EPIDURAL

## 2019-01-21 MED ORDER — TRAMADOL HCL 50 MG PO TABS: 50.0000 mg | ORAL_TABLET | Freq: Four times a day (QID) | ORAL | 0 refills | Status: AC | PRN

## 2019-01-21 MED ORDER — ALBUTEROL SULFATE HFA 108 (90 BASE) MCG/ACT IN AERS
2.0000 | INHALATION_SPRAY | RESPIRATORY_TRACT | Status: DC | PRN
  Filled 2019-01-21: qty 6.7

## 2019-01-21 MED ORDER — MAGNESIUM HYDROXIDE 400 MG/5ML PO SUSP
30.0000 mL | Freq: Every day | ORAL | Status: DC | PRN
  Filled 2019-01-21: qty 30

## 2019-01-21 MED ORDER — MIDAZOLAM HCL 5 MG/5ML IJ SOLN
INTRAMUSCULAR | Status: DC | PRN
  Administered 2019-01-21: 2 mg via INTRAVENOUS

## 2019-01-21 MED ORDER — ENOXAPARIN SODIUM 40 MG/0.4ML ~~LOC~~ SOLN: 40.0000 mg | SUBCUTANEOUS | 0 refills | Status: DC

## 2019-01-21 MED ORDER — FERROUS SULFATE 325 (65 FE) MG PO TABS
325.0000 mg | ORAL_TABLET | Freq: Two times a day (BID) | ORAL | Status: DC
  Administered 2019-01-21 – 2019-01-22 (×2): 325 mg via ORAL
  Filled 2019-01-21 (×2): qty 1

## 2019-01-21 MED ORDER — MIDAZOLAM HCL 2 MG/2ML IJ SOLN
INTRAMUSCULAR | Status: AC
  Filled 2019-01-21: qty 2

## 2019-01-21 MED ORDER — FLEET ENEMA 7-19 GM/118ML RE ENEM: 1.0000 | ENEMA | Freq: Once | RECTAL | Status: DC | PRN

## 2019-01-21 MED ORDER — PHENOL 1.4 % MT LIQD
1.0000 | OROMUCOSAL | Status: DC | PRN
  Filled 2019-01-21: qty 177

## 2019-01-21 MED ORDER — GABAPENTIN 400 MG PO CAPS
ORAL_CAPSULE | ORAL | Status: AC
  Administered 2019-01-21: 300 mg via ORAL
  Filled 2019-01-21: qty 1

## 2019-01-21 MED ORDER — METOCLOPRAMIDE HCL 10 MG PO TABS
5.0000 mg | ORAL_TABLET | Freq: Three times a day (TID) | ORAL | Status: DC | PRN
  Administered 2019-01-21: 10 mg via ORAL

## 2019-01-21 MED ORDER — TRAMADOL HCL 50 MG PO TABS
50.0000 mg | ORAL_TABLET | ORAL | Status: DC | PRN
  Filled 2019-01-21: qty 2

## 2019-01-21 MED ORDER — DEXAMETHASONE SODIUM PHOSPHATE 10 MG/ML IJ SOLN: 8.0000 mg | Freq: Once | INTRAMUSCULAR | Status: AC

## 2019-01-21 MED ORDER — ONDANSETRON HCL 4 MG/2ML IJ SOLN: 4.0000 mg | Freq: Once | INTRAMUSCULAR | Status: DC | PRN

## 2019-01-21 MED ORDER — LACTATED RINGERS IV SOLN: INTRAVENOUS | Status: DC

## 2019-01-21 MED ORDER — CEFAZOLIN SODIUM-DEXTROSE 2-4 GM/100ML-% IV SOLN
2.0000 g | Freq: Four times a day (QID) | INTRAVENOUS | Status: DC
  Administered 2019-01-22 (×2): 2 g via INTRAVENOUS
  Filled 2019-01-21 (×3): qty 100

## 2019-01-21 MED ORDER — CEFAZOLIN SODIUM-DEXTROSE 2-4 GM/100ML-% IV SOLN
2.0000 g | INTRAVENOUS | Status: AC
  Administered 2019-01-21: 08:00:00 2 g via INTRAVENOUS

## 2019-01-21 MED ORDER — PANTOPRAZOLE SODIUM 40 MG PO TBEC
40.0000 mg | DELAYED_RELEASE_TABLET | Freq: Two times a day (BID) | ORAL | Status: DC
  Administered 2019-01-21 – 2019-01-22 (×3): 40 mg via ORAL
  Filled 2019-01-21 (×3): qty 1

## 2019-01-21 MED ORDER — CELECOXIB 200 MG PO CAPS
ORAL_CAPSULE | ORAL | Status: AC
  Administered 2019-01-21: 400 mg via ORAL
  Filled 2019-01-21: qty 2

## 2019-01-21 MED ORDER — ENOXAPARIN SODIUM 30 MG/0.3ML ~~LOC~~ SOLN
30.0000 mg | Freq: Two times a day (BID) | SUBCUTANEOUS | Status: DC
  Administered 2019-01-22: 30 mg via SUBCUTANEOUS
  Filled 2019-01-21 (×2): qty 0.3

## 2019-01-21 MED ORDER — CELECOXIB 200 MG PO CAPS
200.0000 mg | ORAL_CAPSULE | Freq: Two times a day (BID) | ORAL | Status: DC
  Administered 2019-01-21 – 2019-01-22 (×3): 200 mg via ORAL
  Filled 2019-01-21 (×3): qty 1

## 2019-01-21 MED ORDER — SODIUM CHLORIDE FLUSH 0.9 % IV SOLN
INTRAVENOUS | Status: AC
  Filled 2019-01-21: qty 40

## 2019-01-21 MED ORDER — ONDANSETRON HCL 4 MG/2ML IJ SOLN: 4.0000 mg | Freq: Four times a day (QID) | INTRAMUSCULAR | Status: DC | PRN

## 2019-01-21 MED ORDER — ACETAMINOPHEN 10 MG/ML IV SOLN
INTRAVENOUS | Status: AC
  Filled 2019-01-21: qty 100

## 2019-01-21 MED ORDER — OXYCODONE HCL 5 MG PO TABS: 10.0000 mg | ORAL_TABLET | ORAL | Status: DC | PRN

## 2019-01-21 MED ORDER — ACETAMINOPHEN 325 MG PO TABS: 325.0000 mg | ORAL_TABLET | Freq: Four times a day (QID) | ORAL | Status: DC | PRN

## 2019-01-21 MED ORDER — GABAPENTIN 300 MG PO CAPS: 300.0000 mg | ORAL_CAPSULE | Freq: Once | ORAL | Status: AC

## 2019-01-21 MED ORDER — OXYCODONE HCL 5 MG PO TABS: 5.0000 mg | ORAL_TABLET | ORAL | Status: DC | PRN

## 2019-01-21 SURGICAL SUPPLY — 75 items
BATTERY INSTRU NAVIGATION (MISCELLANEOUS) ×12 IMPLANT
BLADE SAW 70X12.5 (BLADE) ×3 IMPLANT
BLADE SAW 90X13X1.19 OSCILLAT (BLADE) ×3 IMPLANT
BLADE SAW 90X25X1.19 OSCILLAT (BLADE) ×3 IMPLANT
CANISTER SUCT 3000ML PPV (MISCELLANEOUS) ×3 IMPLANT
CEMENT HV SMART SET (Cement) ×4 IMPLANT
CEMENT TIBIA MBT SIZE 5 (Knees) IMPLANT
COOLER ICEMAN CLASSIC (MISCELLANEOUS) ×3 IMPLANT
COVER WAND RF STERILE (DRAPES) ×3 IMPLANT
CUFF TOURN SGL QUICK 24 (TOURNIQUET CUFF)
CUFF TOURN SGL QUICK 30 (TOURNIQUET CUFF) ×2
CUFF TRNQT CYL 24X4X16.5-23 (TOURNIQUET CUFF) IMPLANT
CUFF TRNQT CYL 30X4X21-28X (TOURNIQUET CUFF) IMPLANT
DRAPE 3/4 80X56 (DRAPES) ×3 IMPLANT
DRSG DERMACEA 8X12 NADH (GAUZE/BANDAGES/DRESSINGS) ×3 IMPLANT
DRSG OPSITE POSTOP 4X14 (GAUZE/BANDAGES/DRESSINGS) ×7 IMPLANT
DRSG TEGADERM 4X4.75 (GAUZE/BANDAGES/DRESSINGS) ×3 IMPLANT
DURAPREP 26ML APPLICATOR (WOUND CARE) ×6 IMPLANT
ELECT REM PT RETURN 9FT ADLT (ELECTROSURGICAL) ×3
ELECTRODE REM PT RTRN 9FT ADLT (ELECTROSURGICAL) ×1 IMPLANT
EX-PIN ORTHOLOCK NAV 4X150 (PIN) ×6 IMPLANT
FEMUR SIGMA PS SZ 5.0 R (Femur) ×2 IMPLANT
GLOVE BIO SURGEON STRL SZ7.5 (GLOVE) ×6 IMPLANT
GLOVE BIOGEL M STRL SZ7.5 (GLOVE) ×6 IMPLANT
GLOVE BIOGEL PI IND STRL 7.5 (GLOVE) ×1 IMPLANT
GLOVE BIOGEL PI INDICATOR 7.5 (GLOVE) ×2
GLOVE INDICATOR 8.0 STRL GRN (GLOVE) ×3 IMPLANT
GOWN STRL REUS W/ TWL LRG LVL3 (GOWN DISPOSABLE) ×2 IMPLANT
GOWN STRL REUS W/ TWL XL LVL3 (GOWN DISPOSABLE) ×1 IMPLANT
GOWN STRL REUS W/TWL LRG LVL3 (GOWN DISPOSABLE) ×4
GOWN STRL REUS W/TWL XL LVL3 (GOWN DISPOSABLE) ×2
HEMOVAC 400CC 10FR (MISCELLANEOUS) ×1 IMPLANT
HOLDER FOLEY CATH W/STRAP (MISCELLANEOUS) ×3 IMPLANT
HOOD PEEL AWAY FLYTE STAYCOOL (MISCELLANEOUS) ×6 IMPLANT
KIT TURNOVER KIT A (KITS) ×3 IMPLANT
KNIFE SCULPS 14X20 (INSTRUMENTS) ×3 IMPLANT
LABEL OR SOLS (LABEL) ×3 IMPLANT
MANIFOLD NEPTUNE II (INSTRUMENTS) ×3 IMPLANT
NDL SAFETY ECLIPSE 18X1.5 (NEEDLE) ×1 IMPLANT
NDL SPNL 20GX3.5 QUINCKE YW (NEEDLE) ×2 IMPLANT
NEEDLE HYPO 18GX1.5 SHARP (NEEDLE) ×2
NEEDLE SPNL 20GX3.5 QUINCKE YW (NEEDLE) ×6 IMPLANT
NS IRRIG 500ML POUR BTL (IV SOLUTION) ×3 IMPLANT
PACK TOTAL KNEE (MISCELLANEOUS) ×3 IMPLANT
PAD COLD SHLDR UNI WRAP-ON (PAD) ×3
PAD COLD UNI WRAP-ON (PAD) IMPLANT
PAD WRAPON POLAR KNEE (MISCELLANEOUS) ×1 IMPLANT
PATELLA DOME PFC 38MM (Knees) ×2 IMPLANT
PENCIL SMOKE EVACUATOR COATED (MISCELLANEOUS) ×3 IMPLANT
PENCIL SMOKE ULTRAEVAC 22 CON (MISCELLANEOUS) ×3 IMPLANT
PIN DRILL QUICK PACK ×3 IMPLANT
PIN FIXATION 1/8DIA X 3INL (PIN) ×9 IMPLANT
PLATE ROT INSERT 10MM SIZE 5 (Plate) ×2 IMPLANT
PULSAVAC PLUS IRRIG FAN TIP (DISPOSABLE) ×3
SOL .9 NS 3000ML IRR  AL (IV SOLUTION) ×2
SOL .9 NS 3000ML IRR UROMATIC (IV SOLUTION) ×1 IMPLANT
SOL PREP PVP 2OZ (MISCELLANEOUS) ×3
SOLUTION PREP PVP 2OZ (MISCELLANEOUS) ×1 IMPLANT
SPONGE DRAIN TRACH 4X4 STRL 2S (GAUZE/BANDAGES/DRESSINGS) ×1 IMPLANT
STAPLER SKIN PROX 35W (STAPLE) ×3 IMPLANT
STOCKINETTE IMPERV 14X48 (MISCELLANEOUS) ×2 IMPLANT
STRAP TIBIA SHORT (MISCELLANEOUS) ×3 IMPLANT
SUCTION FRAZIER HANDLE 10FR (MISCELLANEOUS) ×2
SUCTION TUBE FRAZIER 10FR DISP (MISCELLANEOUS) ×1 IMPLANT
SUT VIC AB 0 CT1 36 (SUTURE) ×6 IMPLANT
SUT VIC AB 1 CT1 36 (SUTURE) ×6 IMPLANT
SUT VIC AB 2-0 CT2 27 (SUTURE) ×3 IMPLANT
SYR 20ML LL LF (SYRINGE) ×3 IMPLANT
SYR 30ML LL (SYRINGE) ×6 IMPLANT
TIBIA MBT CEMENT SIZE 5 (Knees) ×3 IMPLANT
TIP FAN IRRIG PULSAVAC PLUS (DISPOSABLE) ×1 IMPLANT
TOWEL OR 17X26 4PK STRL BLUE (TOWEL DISPOSABLE) ×3 IMPLANT
TOWER CARTRIDGE SMART MIX (DISPOSABLE) ×3 IMPLANT
TRAY FOLEY MTR SLVR 16FR STAT (SET/KITS/TRAYS/PACK) ×3 IMPLANT
WRAPON POLAR PAD KNEE (MISCELLANEOUS)

## 2019-01-21 NOTE — Anesthesia Procedure Notes (Addendum)
Epidural Patient location during procedure: OR Start time: 01/21/2019 7:36 AM End time: 01/21/2019 7:45 AM  Staffing Anesthesiologist: Gunnar Fusi, MD Resident/CRNA: Lia Foyer, CRNA Performed: anesthesiologist   Preanesthetic Checklist Completed: patient identified, IV checked, site marked, risks and benefits discussed, surgical consent, monitors and equipment checked and pre-op evaluation  Epidural Patient position: sitting Prep: DuraPrep Patient monitoring: heart rate, cardiac monitor, continuous pulse ox and blood pressure Approach: midline Location: L3-L4 Injection technique: LOR saline  Needle:  Needle type: Tuohy  Needle gauge: 17 G Needle length: 9 cm Catheter type: closed end flexible Catheter size: 20 Guage Test dose: negative and 1.5% lidocaine with Epi 1:200 K  Additional Notes Reason for block:surgical anesthesia

## 2019-01-21 NOTE — Progress Notes (Signed)
OT Cancellation Note  Patient Details Name: [name] MRN: [id number] DOB: 02/21/1950   Cancelled Treatment:    Reason Eval/Treat Not Completed: Medical issues which prohibited therapy. Consult received, chart reviewed. Spoke with PT who just worked with pt. Per PT, pt being admitted. Does not recommend assessing at this time 2/2 BP concerns. Will hold and re-attempt OT evaluation tomorrow morning pending medically appropriate.   Jeni Salles, MPH, MS, OTR/L ascom [phone] 01/21/19, 2:20 PM

## 2019-01-21 NOTE — Progress Notes (Signed)
Physical Therapy Treatment Patient Details Name: [name] MRN: [id number] DOB: December 19, 1950 Today's Date: 01/21/2019    History of Present Illness Pt is a 68 yo male diagnosed with degenerative arthrosis of the right knee and is s/p elective R TKA. PMH includes L TKA and asthma.    PT Comments    Pt presented with deficits in strength, transfers, mobility, gait, balance, R knee ROM, and activity tolerance but made good progress towards goals this session.  Pt's BP taken in standing at 108/70 with no adverse symptoms noted.  Pt demonstrated good RLE control in and out of bed and was steady with transfers and gait.  Pt's gait was antalgic with step-to pattern and mod lean on the RW causing him to fatigue quickly with amb 80' being his max distance this session.  Pt highly motivated to participate during the session and should continue to make excellent progress while in acute care.  Pt will benefit from HHPT services upon discharge to safely address above deficits for decreased caregiver assistance and eventual return to PLOF.     Follow Up Recommendations  Home health PT     Equipment Recommendations  Rolling walker with 5" wheels    Recommendations for Other Services       Precautions / Restrictions Precautions Precautions: Fall;Knee Precaution Booklet Issued: Yes (comment) Precaution Comments: Pt able to perform Ind RLE SLR without ext lag, no KI required Restrictions Weight Bearing Restrictions: Yes RLE Weight Bearing: Weight bearing as tolerated    Mobility  Bed Mobility Overal bed mobility: Modified Independent             General bed mobility comments: Extra time and effort required  Transfers Overall transfer level: Needs assistance Equipment used: Rolling walker (2 wheeled) Transfers: Sit to/from Stand Sit to Stand: From elevated surface;Min guard         General transfer comment: Min verbal cues for sequencing for R foot positioning and hand  placement  Ambulation/Gait Ambulation/Gait assistance: Min guard Gait Distance (Feet): 80 Feet Assistive device: Rolling walker (2 wheeled) Gait Pattern/deviations: Step-to pattern;Antalgic;Decreased step length - left;Decreased stance time - right Gait velocity: decreased   General Gait Details: Slow, antalgic gait pattern but steady without LOB; BP taken in standing 108/70 mmHg without adverse symptoms   Stairs             Wheelchair Mobility    Modified Rankin (Stroke Patients Only)       Balance Overall balance assessment: Needs assistance   Sitting balance-Leahy Scale: Normal     Standing balance support: Bilateral upper extremity supported Standing balance-Leahy Scale: Good                              Cognition Arousal/Alertness: Awake/alert Behavior During Therapy: WFL for tasks assessed/performed Overall Cognitive Status: Within Functional Limits for tasks assessed                                        Exercises Total Joint Exercises Ankle Circles/Pumps: AROM;Strengthening;Both;10 reps Quad Sets: Strengthening;AROM;Right;5 reps;10 reps Heel Slides: AROM;Strengthening;Right;10 reps Hip ABduction/ADduction: AROM;Strengthening;Right;10 reps Straight Leg Raises: AROM;Strengthening;Right;10 reps Long Arc Quad: Strengthening;AROM;Right;15 reps;10 reps Knee Flexion: AROM;Strengthening;Right;10 reps;15 reps Goniometric ROM: R knee AROM: 10-88 deg Marching in Standing: AROM;10 reps;Standing;Both Other Exercises Other Exercises: 90 deg R turn training to prevent CKC twisting on  the R knee Other Exercises: HEP review per handout    General Comments        Pertinent Vitals/Pain Pain Assessment: 0-10 Pain Score: 1  Pain Location: R knee Pain Descriptors / Indicators: Sore Pain Intervention(s): Premedicated before session;Monitored during session    Home Living Family/patient expects to be discharged to:: Private  residence Living Arrangements: Spouse/significant other Available Help at Discharge: Family;Available 24 hours/day Type of Home: House Home Access: Ramped entrance   Home Layout: One level Home Equipment: Cane - single point;Bedside commode      Prior Function Level of Independence: Independent      Comments: Pt is active, works outdoors as a Surveyor, quantity, Ind with amb community distances without an AD, Ind with ADLs, one fall in the last 6 months secondary to tripping   PT Goals (current goals can now be found in the care plan section) Acute Rehab 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 Goal Formulation: With patient Time For Goal Achievement: 02/03/19 Potential to Achieve Goals: Good Progress towards PT goals: Progressing toward goals    Frequency    BID      PT Plan Current plan remains appropriate    Co-evaluation              AM-PAC PT "6 Clicks" Mobility   Outcome Measure  Help needed turning from your back to your side while in a flat bed without using bedrails?: A Little Help needed moving from lying on your back to sitting on the side of a flat bed without using bedrails?: A Little Help needed moving to and from a bed to a chair (including a wheelchair)?: A Little Help needed standing up from a chair using your arms (e.g., wheelchair or bedside chair)?: A Little Help needed to walk in hospital room?: A Little Help needed climbing 3-5 steps with a railing? : A Little 6 Click Score: 18    End of Session Equipment Utilized During Treatment: Gait belt Activity Tolerance: Patient tolerated treatment well Patient left: in bed;with nursing/sitter in room;with bed alarm set;with call bell/phone within reach Nurse Communication: Mobility status;Other (comment)(Pt did not have SCDs or a recliner in the room) PT Visit Diagnosis: Muscle weakness (generalized) (M62.81);Other abnormalities of gait and mobility (R26.89)     Time: 0932-6712 PT  Time Calculation (min) (ACUTE ONLY): 28 min  Charges:  $Gait Training: 8-22 mins $Therapeutic Exercise: 8-22 mins $Therapeutic Activity: 8-22 mins                     D. Scott Jazzmyn Filion PT, DPT 01/21/19, 5:33 PM

## 2019-01-21 NOTE — Anesthesia Procedure Notes (Addendum)
Spinal  Patient location during procedure: OR Start time: 01/21/2019 7:36 AM End time: 01/21/2019 7:45 AM Staffing Performed: anesthesiologist  Anesthesiologist: Gunnar Fusi, MD Resident/CRNA: Lia Foyer, CRNA Preanesthetic Checklist Completed: patient identified, IV checked, site marked, risks and benefits discussed, surgical consent, monitors and equipment checked, pre-op evaluation and timeout performed Spinal Block Patient position: sitting Prep: DuraPrep Patient monitoring: heart rate, cardiac monitor, continuous pulse ox and blood pressure Approach: midline Location: L3-4 Injection technique: single-shot Needle Needle type: Quincke  Needle gauge: 25 G Needle length: 9 cm Assessment Sensory level: T4 Additional Notes Combined spinal epidural, dosed 31mL of 0.5% bupivicaine intrathecally

## 2019-01-21 NOTE — Anesthesia Post-op Follow-up Note (Signed)
Anesthesia QCDR form completed.        

## 2019-01-21 NOTE — H&P (Signed)
The patient has been re-examined, and the chart reviewed, and there have been no interval changes to the documented history and physical.    The risks, benefits, and alternatives have been discussed at length. The patient expressed understanding of the risks benefits and agreed with plans for surgical intervention.  James P. Hooten, Jr. M.D.    

## 2019-01-21 NOTE — Evaluation (Signed)
Physical Therapy Evaluation Patient Details Name: [name] MRN: [id number] DOB: [birth year] Today's Date: 01/21/2019   History of Present Illness  Pt is a 68 yo male diagnosed with degenerative arthrosis of the right knee and is s/p elective R TKA. PMH includes L TKA and asthma.    Clinical Impression  Pt presented with deficits in strength, transfers, mobility, gait, balance, R knee ROM, and activity tolerance.  Pt was motivated during the session and actively participated throughout.  Pt was Mod Ind with bed mobility tasks and able to perform Ind RLE SLRs.  Pt was steady with sit to stand transfers but after 30 sec of standing activity began to c/o feeling "woozy" and was returned to sitting at EOB.  Initiated taking BP in sitting with pt unable to tolerate remaining in sitting for the entire BP reading and was returned to supine with BP of 84/57 compared to recent supine BP of 124/82, nursing notified at at bedside at end of session.  Pt will benefit from HHPT services upon discharge to safely address above deficits for decreased caregiver assistance and eventual return to PLOF.      Follow Up Recommendations Home health PT    Equipment Recommendations  Rolling walker with 5" wheels    Recommendations for Other Services       Precautions / Restrictions Precautions Precautions: Fall;Knee Precaution Booklet Issued: Yes (comment) Precaution Comments: Pt able to perform Ind RLE SLR without ext lag, no KI required Restrictions Weight Bearing Restrictions: Yes RLE Weight Bearing: Weight bearing as tolerated      Mobility  Bed Mobility Overal bed mobility: Modified Independent             General bed mobility comments: Extra time and effort required  Transfers Overall transfer level: Needs assistance Equipment used: Rolling walker (2 wheeled) Transfers: Sit to/from Stand Sit to Stand: From elevated surface;Min guard         General transfer comment: Min verbal  cues for sequencing  Ambulation/Gait             General Gait Details: Pt with orthostatic hypotension in standing, unable to attempt ambulation  Stairs            Wheelchair Mobility    Modified Rankin (Stroke Patients Only)       Balance Overall balance assessment: Needs assistance   Sitting balance-Leahy Scale: Normal     Standing balance support: Bilateral upper extremity supported Standing balance-Leahy Scale: Good                               Pertinent Vitals/Pain Pain Assessment: 0-10 Pain Score: 3  Pain Location: R knee Pain Descriptors / Indicators: Sore Pain Intervention(s): Premedicated before session;Monitored during session    Home Living Family/patient expects to be discharged to:: Private residence Living Arrangements: Spouse/significant other Available Help at Discharge: Family;Available 24 hours/day Type of Home: House Home Access: Ramped entrance     Home Layout: One level Home Equipment: Cane - single point;Bedside commode      Prior Function Level of Independence: Independent         Comments: Pt is active, works outdoors as a Educational psychologist, Ind with amb community distances without 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, Ind with ADLs, one fall in the last 6 months secondary to tripping     Hand Dominance        Extremity/Trunk Assessment   Upper Extremity Assessment Upper Extremity Assessment:  Defer to OT evaluation    Lower Extremity Assessment Lower Extremity Assessment: Generalized weakness;RLE deficits/detail RLE Deficits / Details: Pt able to perform Ind RLE SLR without extensor lag, R ankle DF and PF strength WFL RLE: Unable to fully assess due to pain RLE Sensation: WNL       Communication      Cognition Arousal/Alertness: Awake/alert Behavior During Therapy: WFL for tasks assessed/performed Overall Cognitive Status: Within Functional Limits for tasks assessed                                         General Comments      Exercises Total Joint Exercises Ankle Circles/Pumps: AROM;Strengthening;Both;10 reps Quad Sets: Strengthening;AROM;Right;5 reps;10 reps Heel Slides: AROM;Strengthening;Right;10 reps Hip ABduction/ADduction: AROM;Strengthening;Right;10 reps Straight Leg Raises: AROM;Strengthening;Right;10 reps Long Arc Quad: Strengthening;AROM;Right;15 reps;10 reps Knee Flexion: AROM;Strengthening;Right;10 reps;15 reps Goniometric ROM: R knee AROM: 10-88 deg Marching in Standing: AROM;Right;10 reps;Standing Other Exercises Other Exercises: Positioning education to encourage R knee ext PROM and to prevent pressure on heel Other Exercises: HEP education and review per handout   Assessment/Plan    PT Assessment Patient needs continued PT services  PT Problem List Decreased strength;Decreased range of motion;Decreased activity tolerance;Decreased balance;Decreased mobility;Decreased knowledge of use of DME       PT Treatment Interventions DME instruction;Gait training;Stair training;Functional mobility training;Therapeutic activities;Therapeutic exercise;Balance training;Patient/family education    PT Goals (Current goals can be found in the Care Plan section)  Acute Rehab 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 Goal Formulation: With patient Time For Goal Achievement: 02/03/19 Potential to Achieve Goals: Good    Frequency BID   Barriers to discharge        Co-evaluation               AM-PAC PT "6 Clicks" Mobility  Outcome Measure Help needed turning from your back to your side while in a flat bed without using bedrails?: A Little Help needed moving from lying on your back to sitting on the side of a flat bed without using bedrails?: A Little Help needed moving to and from a bed to a chair (including a wheelchair)?: A Little Help needed standing up from a chair using your arms (e.g., wheelchair or bedside chair)?: A Little Help needed to walk in  hospital room?: A Little Help needed climbing 3-5 steps with a railing? : A Little 6 Click Score: 18    End of Session Equipment Utilized During Treatment: Gait belt Activity Tolerance: Treatment limited secondary to medical complications (Comment)(Pt presented with orthostatic hypotension in standing) Patient left: in bed;with nursing/sitter in room;with bed alarm set;Other (comment)(Polar care donned) Nurse Communication: Mobility status;Other (comment)(orthostatic hypotension) PT Visit Diagnosis: Muscle weakness (generalized) (M62.81);Other abnormalities of gait and mobility (R26.89)    Time: 1340-1417 PT Time Calculation (min) (ACUTE ONLY): 37 min   Charges:   PT Evaluation $PT Eval Moderate Complexity: 1 Mod PT Treatments $Therapeutic Exercise: 8-22 mins $Therapeutic Activity: 8-22 mins        D. Royetta Asal PT, DPT 01/21/19, 2:39 PM

## 2019-01-21 NOTE — Op Note (Signed)
OPERATIVE NOTE  DATE OF SURGERY:  01/21/2019  PATIENT NAME:  [name]   DOB: [birth year]  MRN: [id number]  PRE-OPERATIVE DIAGNOSIS: Degenerative arthrosis of the right knee, primary  POST-OPERATIVE DIAGNOSIS:  Same  PROCEDURE:  Right total knee arthroplasty using computer-assisted navigation  SURGEON:  Jena Gauss. M.D.  ASSISTANT: Baldwin Jamaica, PA-C (present and scrubbed throughout the case, critical for assistance with exposure, retraction, instrumentation, and closure)  ANESTHESIA: epidural and spinal  ESTIMATED BLOOD LOSS: 100 mL  FLUIDS REPLACED: 1000 mL of crystalloid  TOURNIQUET TIME: 114 minutes  DRAINS: 2 medium Hemovac drains  SOFT TISSUE RELEASES: Anterior cruciate ligament, posterior cruciate ligament, deep medial collateral ligament, patellofemoral ligament  IMPLANTS UTILIZED: DePuy PFC Sigma size 5 posterior stabilized femoral component (cemented), size 5 MBT tibial component (cemented), 38 mm 3 peg oval dome patella (cemented), and a 10 mm stabilized rotating platform polyethylene insert.  INDICATIONS FOR SURGERY: [name] is a 68 y.o. year old male with a long history of progressive knee pain. X-rays demonstrated severe degenerative changes in tricompartmental fashion. The patient had not seen any significant improvement despite conservative nonsurgical intervention. After discussion of the risks and benefits of surgical intervention, the patient expressed understanding of the risks benefits and agree with plans for total knee arthroplasty.   The risks, benefits, and alternatives were discussed at length including but not limited to the risks of infection, bleeding, nerve injury, stiffness, blood clots, the need for revision surgery, cardiopulmonary complications, among others, and they were willing to proceed.  PROCEDURE IN DETAIL: The patient was brought into the operating room and, after adequate epidural and spinal anesthesia was achieved, a  tourniquet was placed on the patient's upper thigh. The patient's knee and leg were cleaned and prepped with alcohol and DuraPrep and draped in the usual sterile fashion. A "timeout" was performed as per usual protocol. The lower extremity was exsanguinated using an Esmarch, and the tourniquet was inflated to 300 mmHg. An anterior longitudinal incision was made followed by a standard mid vastus approach. The deep fibers of the medial collateral ligament were elevated in a subperiosteal fashion off of the medial flare of the tibia so as to maintain a continuous soft tissue sleeve. The patella was subluxed laterally and the patellofemoral ligament was incised. Inspection of the knee demonstrated severe degenerative changes with full-thickness loss of articular cartilage. Osteophytes were debrided using a rongeur. Anterior and posterior cruciate ligaments were excised. Two 4.0 mm Schanz pins were inserted in the femur and into the tibia for attachment of the array of trackers used for computer-assisted navigation. Hip center was identified using a circumduction technique. Distal landmarks were mapped using the computer. The distal femur and proximal tibia were mapped using the computer. The distal femoral cutting guide was positioned using computer-assisted navigation so as to achieve a 5 distal valgus cut. The femur was sized and it was felt that a size 5 femoral component was appropriate. A size 5 femoral cutting guide was positioned and the anterior cut was performed and verified using the computer. This was followed by completion of the posterior and chamfer cuts. Femoral cutting guide for the central box was then positioned in the center box cut was performed.  Attention was then directed to the proximal tibia. Medial and lateral menisci were excised. The extramedullary tibial cutting guide was positioned using computer-assisted navigation so as to achieve a 0 varus-valgus alignment and 0 posterior slope. The  cut was performed and verified using  the computer. The proximal tibia was sized and it was felt that a size 5 tibial tray was appropriate. Tibial and femoral trials were inserted followed by insertion of a 10 mm polyethylene insert. This allowed for excellent mediolateral soft tissue balancing both in flexion and in full extension. Finally, the patella was cut and prepared so as to accommodate a 38 mm 3 peg oval dome patella. A patella trial was placed and the knee was placed through a range of motion with excellent patellar tracking appreciated. The femoral trial was removed after debridement of posterior osteophytes. The central post-hole for the tibial component was reamed followed by insertion of a keel punch. Tibial trials were then removed. Cut surfaces of bone were irrigated with copious amounts of normal saline with antibiotic solution using pulsatile lavage and then suctioned dry. Polymethylmethacrylate cement was prepared in the usual fashion using a vacuum mixer. Cement was applied to the cut surface of the proximal tibia as well as along the undersurface of a size 5 MBT tibial component. Tibial component was positioned and impacted into place. Excess cement was removed using [employer] fast streamer. Cement was then applied to the cut surfaces of the femur as well as along the posterior flanges of the size 5 femoral component. The femoral component was positioned and impacted into place. Excess cement was removed using [employer] fast streamer. A 10 mm polyethylene trial was inserted and the knee was brought into full extension with steady axial compression applied. Finally, cement was applied to the backside of a 38 mm 3 peg oval dome patella and the patellar component was positioned and patellar clamp applied. Excess cement was removed using [employer] fast streamer. After adequate curing of the cement, the tourniquet was deflated after a total tourniquet time of 114 minutes. Hemostasis was achieved using electrocautery. The  knee was irrigated with copious amounts of normal saline with antibiotic solution using pulsatile lavage and then suctioned dry. 20 mL of 1.3% Exparel and 60 mL of 0.25% Marcaine in 40 mL of normal saline was injected along the posterior capsule, medial and lateral gutters, and along the arthrotomy site. A 10 mm stabilized rotating platform polyethylene insert was inserted and the knee was placed through a range of motion with excellent mediolateral soft tissue balancing appreciated and excellent patellar tracking noted. 2 medium drains were placed in the wound bed and brought out through separate stab incisions. The medial parapatellar portion of the incision was reapproximated using interrupted sutures of #1 Vicryl. Subcutaneous tissue was approximated in layers using first #0 Vicryl followed #2-0 Vicryl. The skin was approximated with skin staples. A sterile dressing was applied.  The patient tolerated the procedure well and was transported to the recovery room in stable condition.    Miaisabella Bacorn P. Holley Bouche., M.D.

## 2019-01-21 NOTE — Transfer of Care (Signed)
Immediate Anesthesia Transfer of Care Note  Patient: [name]  Procedure(s) Performed: COMPUTER ASSISTED TOTAL KNEE ARTHROPLASTY RIGHT (Right Knee)  Patient Location: PACU  Anesthesia Type:General  Level of Consciousness: awake, drowsy and patient cooperative  Airway & Oxygen Therapy: Patient Spontanous Breathing and Patient connected to face mask oxygen  Post-op Assessment: Report given to RN and Post -op Vital signs reviewed and stable  Post vital signs: Reviewed and stable, low BP relayed to Dr. Ronelle Nigh for management in PACU  Last Vitals:  Vitals Value Taken Time  BP 85/51 01/21/19 1205  Temp 36.5 C 01/21/19 1205  Pulse 69 01/21/19 1207  Resp 23 01/21/19 1207  SpO2 98 % 01/21/19 1207  Vitals shown include unvalidated device data.  Last Pain:  Vitals:   01/21/19 2458  TempSrc: Tympanic  PainSc: 0-No pain      Patients Stated Pain Goal: 0 (09/98/33 8250)  Complications: No apparent anesthesia complications

## 2019-01-21 NOTE — Anesthesia Preprocedure Evaluation (Signed)
Anesthesia Evaluation  Patient identified by MRN, date of birth, ID band Patient awake    Reviewed: Allergy & Precautions, NPO status , Patient's Chart, lab work & pertinent test results  History of Anesthesia Complications Negative for: history of anesthetic complications  Airway Mallampati: III       Dental   Pulmonary asthma , neg sleep apnea, neg COPD, Not current smoker,           Cardiovascular (-) hypertension(-) Past MI and (-) CHF (-) dysrhythmias (-) Valvular Problems/Murmurs     Neuro/Psych neg Seizures    GI/Hepatic Neg liver ROS, GERD  Medicated and Controlled,  Endo/Other  neg diabetes  Renal/GU negative Renal ROS     Musculoskeletal   Abdominal   Peds  Hematology   Anesthesia Other Findings   Reproductive/Obstetrics                             Anesthesia Physical Anesthesia Plan  ASA: II  Anesthesia Plan: Combined Spinal and Epidural   Post-op Pain Management:    Induction:   PONV Risk Score and Plan:   Airway Management Planned:   Additional Equipment:   Intra-op Plan:   Post-operative Plan:   Informed Consent: I have reviewed the patients History and Physical, chart, labs and discussed the procedure including the risks, benefits and alternatives for the proposed anesthesia with the patient or authorized representative who has indicated his/her understanding and acceptance.       Plan Discussed with:   Anesthesia Plan Comments:         Anesthesia Quick Evaluation

## 2019-01-21 NOTE — Discharge Instructions (Signed)
AMBULATORY SURGERY  DISCHARGE INSTRUCTIONS   1) The drugs that you were given will stay in your system until tomorrow so for the next 24 hours you should not:  A) Drive an automobile B) Make any legal decisions C) Drink any alcoholic beverage   2) You may resume regular meals tomorrow.  Today it is better to start with liquids and gradually work up to solid foods.  You may eat anything you prefer, but it is better to start with liquids, then soup and crackers, and gradually work up to solid foods.   3) Please notify your doctor immediately if you have any unusual bleeding, trouble breathing, redness and pain at the surgery site, drainage, fever, or pain not relieved by medication. 4)   5) Your post-operative visit with Dr.                                     is: Date:                        Time:    Please call to schedule your post-operative visit.  6) Additional Instructions:      Instructions after Total Knee Replacement   James P. Angie Fava., M.D.     Dept. of Orthopaedics & Sports Medicine  [facility]  [address]  [place], [place]  [address]  Phone: [phone]   Fax: [phone]    DIET:  Drink plenty of non-alcoholic fluids.  Resume your normal diet. Include foods high in fiber.  ACTIVITY:   You may use crutches or a walker with weight-bearing as tolerated, unless instructed otherwise.  You may be weaned off of the walker or crutches by your Physical Therapist.   Do NOT place pillows under the knee. Anything placed under the knee could limit your ability to straighten the knee.    Continue doing gentle exercises. Exercising will reduce the pain and swelling, increase motion, and prevent muscle weakness.    Please continue to use the TED compression stockings for 6 weeks. You may remove the stockings at night, but should reapply them in the morning.  Do not drive or operate any equipment until instructed.  WOUND CARE:   Continue to use  the PolarCare or ice packs periodically to reduce pain and swelling.  You may bathe or shower after the staples are removed at the first office visit following surgery.  MEDICATIONS:  You may resume your regular medications.  Please take the pain medication as prescribed on the medication.  Do not take pain medication on an empty stomach.  You have been given a prescription for a blood thinner (Lovenox or Coumadin). Please take the medication as instructed. (NOTE: After completing a 2 week course of Lovenox, take one Enteric-coated aspirin once a day. This along with elevation will help reduce the possibility of phlebitis in your operated leg.)  Do not drive or drink alcoholic beverages when taking pain medications.  CALL THE OFFICE FOR:  Temperature above 101 degrees  Excessive bleeding or drainage on the dressing.  Excessive swelling, coldness, or paleness of the toes.  Persistent nausea and vomiting.  FOLLOW-UP:   You should have an appointment to return to the office in 10-14 days after surgery.  Arrangements have been made for continuation of Physical Therapy (either home therapy or outpatient therapy).     [facility] Department Directory  [email or web address]       [email or web address]          Cardiology  Appointments: [place] [phone]  Endocrinology  Appointments: [place] [phone] [place] [phone]  Gastroenterology  Appointments: [place] [phone] [place] [phone]        General Surgery   Appointments: [facility]  Internal Medicine/Family Medicine  Appointments: [facility] - [phone] [place] [phone]  Metabolic and Conception Loss Surgery  Appointments: [facility]        Neurology  Appointments: [place] [phone] [place] - [phone]   Neurosurgery  Appointments: [place]  Obstetrics & Gynecology  Appointments: [place] [phone] [place] - [phone]        Pediatrics  Appointments: [place] [phone] [place] - [phone]  Physiatry  Appointments: [place] [phone]  Physical Therapy  Appointments: [place] [phone]        Podiatry  Appointments: [place] [phone] [place] - [phone]  Pulmonology  Appointments: [place]  Rheumatology  Appointments: [place] [phone]        [place] Location: [facility]  [place], [place]  [address]  [place] Location: [facility] [address] [place], [place]  [address]  [place] Location: [facility] [address] [place], [place]  [address]

## 2019-01-22 DIAGNOSIS — M1711 Unilateral primary osteoarthritis, right knee: Secondary | ICD-10-CM | POA: Diagnosis not present

## 2019-01-22 NOTE — Evaluation (Signed)
Occupational Therapy Evaluation Patient Details Name: [name] MRN: [id number] DOB: 20-Feb-1950 Today's Date: 01/22/2019    History of Present Illness Pt is a 68 yo male diagnosed with degenerative arthrosis of the right knee and is s/p elective R TKA. PMH includes L TKA and asthma.   Clinical Impression   Pt seen for OT evaluation this date, POD#1 from above surgery. Pt was independent in all ADL prior to surgery, active, and is a pumpkin farmer. Pt is eager to return to PLOF with less pain and improved safety and independence. Pt currently requires PRN minimal assist for LB dressing and bathing while in seated position due to limited strength and AROM of R knee. Pt denied pain throughout session. Supervision to [facility] for functional ADL transfers and mobility with RW. Pt denied dizziness during mobility. Orthostatic vitals taken and negative (see below for details). Pt/wife instructed in [employer], falls prevention strategies, home/routines modifications, DME/AE for LB bathing and dressing tasks, and compression stocking mgt. Handout provided to support recall and carryover.  Do not currently anticipate any  Additional skilled OT needs at this time. Pt plans to discharge today and return home with family assist.      Follow Up Recommendations  No OT follow up    Equipment Recommendations  None recommended by OT    Recommendations for Other Services       Precautions / Restrictions Precautions Precautions: Fall;Knee Precaution Booklet Issued: Yes (comment) Precaution Comments: Pt able to perform Ind RLE SLR without ext lag, no KI required Restrictions Weight Bearing Restrictions: Yes RLE Weight Bearing: Weight bearing as tolerated      Mobility Bed Mobility Overal bed mobility: Modified Independent                Transfers Overall transfer level: Needs assistance Equipment used: Rolling walker (2 wheeled) Transfers: Sit to/from Stand Sit to Stand: From  elevated surface;Min guard;Supervision              Balance Overall balance assessment: Mild deficits observed, not formally tested                                         ADL either performed or assessed with clinical judgement   ADL Overall ADL's : Modified independent                                       General ADL Comments: PRN Min A for LB ADL, wife able to assist with compression stockings and Iceman mgt     Vision Baseline Vision/History: Wears glasses Wears Glasses: At all times Patient Visual Report: No change from baseline       Perception     Praxis      Pertinent Vitals/Pain Pain Assessment: No/denies pain     Hand Dominance     Extremity/Trunk Assessment Upper Extremity Assessment Upper Extremity Assessment: Overall WFL for tasks assessed   Lower Extremity Assessment Lower Extremity Assessment: RLE deficits/detail(LLE WFL) RLE Deficits / Details: expected post-op strength/ROM deficits, but not overly functionally limiting   Cervical / Trunk Assessment Cervical / Trunk Assessment: Normal   Communication Communication Communication: No difficulties   Cognition Arousal/Alertness: Awake/alert Behavior During Therapy: WFL for tasks assessed/performed Overall Cognitive Status: Within Functional Limits for tasks assessed  General Comments       Exercises Other Exercises Other Exercises: Pt/spouse instructed in [employer] Iceman mgt, compression stocking mgt, falls prevention, AE/DME, and home/routines modifications; handout provided to support recall and carryover   Shoulder Instructions      Home Living Family/patient expects to be discharged to:: Private residence Living Arrangements: Spouse/significant other Available Help at Discharge: Family;Available 24 hours/day Type of Home: House Home Access: Ramped entrance     Home Layout: One level                Home Equipment: Cane - single point;Bedside commode          Prior Functioning/Environment Level of Independence: Independent        Comments: Pt is active, works outdoors as a Surveyor, quantity, Ind with amb community distances without an AD, Ind with ADLs, one fall in the last 6 months secondary to tripping        OT Problem List: Decreased strength;Decreased range of motion      OT Treatment/Interventions:      OT Goals(Current goals can be found in the care plan section) Acute Rehab OT Goals 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 OT Goal Formulation: All assessment and education complete, DC therapy  OT Frequency:     Barriers to D/C:            Co-evaluation              AM-PAC OT "6 Clicks" Daily Activity     Outcome Measure Help from another person eating meals?: None Help from another person taking care of personal grooming?: None Help from another person toileting, which includes using toliet, bedpan, or urinal?: A Little Help from another person bathing (including washing, rinsing, drying)?: A Little Help from another person to put on and taking off regular upper body clothing?: None Help from another person to put on and taking off regular lower body clothing?: A Little 6 Click Score: 21   End of Session Equipment Utilized During Treatment: Gait belt;Rolling walker  Activity Tolerance: Patient tolerated treatment well Patient left: in bed;with call bell/phone within reach;with nursing/sitter in room;with family/visitor present  OT Visit Diagnosis: Other abnormalities of gait and mobility (R26.89)                Time: 4970-2637 OT Time Calculation (min): 23 min Charges:  OT General Charges $OT Visit: 1 Visit OT Evaluation $OT Eval Low Complexity: 1 Low OT Treatments $Self Care/Home Management : 8-22 mins  Jeni Salles, MPH, MS, OTR/L ascom [phone] 01/22/19, 9:07 AM

## 2019-01-22 NOTE — Progress Notes (Signed)
  Subjective: 1 Day Post-Op Procedure(s) (LRB): COMPUTER ASSISTED TOTAL KNEE ARTHROPLASTY RIGHT (Right) Patient reports pain as well-controlled.   Patient is well, and has had no acute complaints or problems Plan is to go Home after hospital stay. Negative for chest pain and shortness of breath Fever: no Gastrointestinal: negative for nausea and vomiting.  Patient has not had a bowel movement.  Objective: Vital signs in last 24 hours: Temp:  [97.1 F (36.2 C)-98.2 F (36.8 C)] 98.2 F (36.8 C) (12/24 0739) Pulse Rate:  [49-82] 79 (12/24 0739) Resp:  [14-20] 18 (12/24 0739) BP: (84-141)/(49-108) 118/68 (12/24 0739) SpO2:  [95 %-99 %] 95 % (12/24 0739) FiO2 (%):  [21 %] 21 % (12/23 1204)  Intake/Output from previous day:  Intake/Output Summary (Last 24 hours) at 01/22/2019 0829 Last data filed at 01/21/2019 1900 Gross per 24 hour  Intake 1990 ml  Output 700 ml  Net 1290 ml    Intake/Output this shift: No intake/output data recorded.  Labs: No results for input(s): HGB in the last 72 hours. No results for input(s): WBC, RBC, HCT, PLT in the last 72 hours. No results for input(s): NA, K, CL, CO2, BUN, CREATININE, GLUCOSE, CALCIUM in the last 72 hours. No results for input(s): LABPT, INR in the last 72 hours.   EXAM General - Patient is Alert, Appropriate and Oriented Extremity - Neurovascular intact Dorsiflexion/Plantar flexion intact Compartment soft Dressing/Incision -Postoperative dressing remains in place., Polar Care in place and working.  Motor Function - intact, moving foot and toes well on exam. Able to perform straight leg raise  Cardiovascular- Regular rate and rhythm, no murmurs/rubs/gallops Respiratory- Lungs clear to auscultation bilaterally Gastrointestinal- soft, nontender and active bowel sounds   Assessment/Plan: 1 Day Post-Op Procedure(s) (LRB): COMPUTER ASSISTED TOTAL KNEE ARTHROPLASTY RIGHT (Right) Active Problems:   Total knee replacement  status, right  Estimated body mass index is 25.84 kg/m as calculated from the following:   Height as of this encounter: 5\' 7"  (1.702 m).   Weight as of this encounter: 74.8 kg. Advance diet Up with therapy Discharge home with home health today.    DVT Prophylaxis - Lovenox, Ted hose and foot pumps Weight-Bearing as tolerated to right leg  Cassell Smiles, PA-C [facility] Orthopaedic Surgery 01/22/2019, 8:29 AM

## 2019-01-22 NOTE — TOC Transition Note (Signed)
Transition of Care [facility]) - CM/SW Discharge Note   Patient Details  Name: [name] MRN: [id number] Date of Birth: 1950/04/20  [facility] CM/SW Contact:  Su Hilt, RN Phone Number: 01/22/2019, 8:35 AM   Clinical Narrative:     Met with the patient to discuss DC plan and needs He is set up with [facility] for [facility] services and I delivered a RW to his room for him to take home from [place], Notified Brad with [employer]. No additional Needs       Patient Goals and CMS Choice        Discharge Placement                       Discharge Plan and Services                                     Social Determinants of Health (SDOH) Interventions     Readmission Risk Interventions No flowsheet data found.

## 2019-01-22 NOTE — Discharge Summary (Signed)
Physician Discharge Summary  Patient ID: [name] MRN: [id number] DOB/AGE: 04/25/1950 68 y.o.  Admit date: 01/21/2019 Discharge date: 01/22/2019  Admission Diagnoses:  Total knee replacement status, right [Z96.651]  Surgeries:  Right total knee arthroplasty using computer-assisted navigation  SURGEON:  Marciano Sequin. M.D.  ASSISTANT: Cassell Smiles, PA-C (present and scrubbed throughout the case, critical for assistance with exposure, retraction, instrumentation, and closure)  ANESTHESIA: epidural and spinal  ESTIMATED BLOOD LOSS: 100 mL  FLUIDS REPLACED: 1000 mL of crystalloid  TOURNIQUET TIME: 114 minutes  DRAINS: 2 medium Hemovac drains  SOFT TISSUE RELEASES: Anterior cruciate ligament, posterior cruciate ligament, deep medial collateral ligament, patellofemoral ligament  IMPLANTS UTILIZED: DePuy PFC Sigma size 5 posterior stabilized femoral component (cemented), size 5 MBT tibial component (cemented), 38 mm 3 peg oval dome patella (cemented), and a 10 mm stabilized rotating platform polyethylene insert.  Discharge Diagnoses: Patient Active Problem List   Diagnosis Date Noted  . Total knee replacement status, right 01/21/2019  . Rotator cuff tendinitis, right 04/18/2017  . Cubital tunnel syndrome on right 03/06/2017  . Tendinitis of upper biceps tendon of right shoulder 03/06/2017  . Bilateral carpal tunnel syndrome 02/08/2017  . Polyneuropathy 02/08/2017  . Incomplete emptying of bladder 11/20/2016  . Asthma 05/10/2014  . GERD without esophagitis 05/10/2014  . HLD (hyperlipidemia) 05/10/2014  . Primary osteoarthritis of right knee 05/10/2014  . S/P total knee replacement using cement, left 02/02/2014  . Benign localized hyperplasia of prostate with urinary obstruction 10/09/2011  . Chronic prostatitis 10/09/2011    Past Medical History:  Diagnosis Date  . Arthritis   . Asthma   . GERD (gastroesophageal reflux disease)      Transfusion:  n/a   Consultants (if any):   Discharged Condition: Improved  Hospital Course: [name] is an 68 y.o. male who was admitted 01/21/2019 with a diagnosis of right knee osteoarthritis and went to the operating room on 01/21/2019 and underwent right total knee arthroplasty. The patient received perioperative antibiotics for prophylaxis (see below). The patient tolerated the procedure well and was transported to PACU in stable condition. After meeting PACU criteria, the patient was subsequently transferred to the Orthopaedics/Rehabilitation unit.   The patient received DVT prophylaxis in the form of early mobilization, Lovenox. A sacral pad had been placed and heels were elevated off of the bed with rolled towels in order to protect skin integrity. Foley catheter was discontinued same day as the surgery.    Physical therapy was initiated postoperatively for transfers, gait training, and strengthening. Occupational therapy was initiated for activities of daily living and evaluation for assisted devices. Rehabilitation goals were reviewed in detail with the patient. The patient made steady progress with physical therapy and physical therapy recommended discharge to Home.   The patient achieved his preliminary goals of this hospitalization and was felt to be medically and orthopaedically appropriate for discharge.  He was given perioperative antibiotics:  Anti-infectives (From admission, onward)   Start     Dose/Rate Route Frequency Ordered Stop   01/21/19 1215  ceFAZolin (ANCEF) IVPB 2g/100 mL premix     2 g 200 mL/hr over 30 Minutes Intravenous Every 6 hours 01/21/19 1204 01/22/19 1214   01/21/19 0611  ceFAZolin (ANCEF) 2-4 GM/100ML-% IVPB    Note to Pharmacy: Register, Karen   : cabinet override      01/21/19 0611 01/21/19 1912   01/21/19 0600  ceFAZolin (ANCEF) IVPB 2g/100 mL premix     2 g 200  mL/hr over 30 Minutes Intravenous On call to O.R. 01/21/19 [id number] 01/21/19 0752    .  Recent  vital signs:  Vitals:   01/21/19 2358 01/22/19 0739  BP: 113/60 118/68  Pulse: 78 79  Resp: 20 18  Temp: 98.1 F (36.7 C) 98.2 F (36.8 C)  SpO2: 95% 95%    Recent laboratory studies:  No results for input(s): WBC, HGB, HCT, PLT, K, CL, CO2, BUN, CREATININE, GLUCOSE, CALCIUM, LABPT, INR in the last 72 hours.  Diagnostic Studies: DG Knee Right Port  Result Date: 01/21/2019 CLINICAL DATA:  Status post right total knee replacement today. EXAM: PORTABLE RIGHT KNEE - 1-2 VIEW COMPARISON:  None. FINDINGS: Total knee arthroplasty is in place. There is some gas in the soft tissues from surgery and surgical staples are noted. No acute abnormality is identified. Hardware is normal in appearance. IMPRESSION: Status post right knee replacement.  No acute finding. Electronically Signed   By: Drusilla Kannerhomas  Dalessio M.D.   On: 01/21/2019 12:26    Discharge Medications:   Allergies as of 01/22/2019   No Known Allergies     Medication List    TAKE these medications   acidophilus Caps capsule Take 1 capsule by mouth daily.   Advair Diskus 250-50 MCG/DOSE Aepb Generic drug: Fluticasone-Salmeterol Inhale 1 puff into the lungs daily.   CANNABIDIOL PO Take 30 mg by mouth daily.   enoxaparin 40 MG/0.4ML injection Commonly known as: LOVENOX Inject 0.4 mLs (40 mg total) into the skin daily.   esomeprazole 40 MG capsule Commonly known as: NEXIUM Take 40 mg by mouth daily.   fluticasone 50 MCG/ACT nasal spray Commonly known as: FLONASE Place 2 sprays into both nostrils daily as needed for allergies or rhinitis.   meloxicam 7.5 MG tablet Commonly known as: MOBIC Take 7.5 mg by mouth daily.   montelukast 10 MG tablet Commonly known as: SINGULAIR Take 10 mg by mouth daily.   oxyCODONE 5 MG immediate release tablet Commonly known as: Roxicodone Take 1-2 tablets (5-10 mg total) by mouth every 4 (four) hours as needed for severe pain.   tamsulosin 0.4 MG Caps capsule Commonly known as:  FLOMAX Take 0.4 mg by mouth daily.   traMADol 50 MG tablet Commonly known as: Ultram Take 1 tablet (50 mg total) by mouth every 6 (six) hours as needed for moderate pain.   Turmeric 500 MG Caps Take 500 mg by mouth daily.   Ventolin HFA 108 (90 Base) MCG/ACT inhaler Generic drug: albuterol Inhale 2 puffs into the lungs every 4 (four) hours as needed for wheezing or shortness of breath.            Discharge Care Instructions  (From admission, onward)         Start     Ordered   01/21/19 0000  Weight bearing as tolerated    Question:  Laterality  Answer:  bilateral   01/21/19 1214   01/21/19 0000  Change dressing    Comments: Change dressing on 01/23/2019, then change the dressing as needed with honeycomb dressing and apply TED hose.  You may clean the incision with alcohol prior to redressing.   01/21/19 1214          Disposition: Discharge disposition: 01-Home or Self Care       Discharge Instructions    Call MD / Call 911   Complete by: As directed    If you experience chest pain or shortness of breath, CALL 911 and be transported to  the hospital emergency room.  If you develope a fever above 101 F, pus (white drainage) or increased drainage or redness at the wound, or calf pain, call your surgeon's office.   Change dressing   Complete by: As directed    Change dressing on 01/23/2019, then change the dressing as needed with honeycomb dressing and apply TED hose.  You may clean the incision with alcohol prior to redressing.   Constipation Prevention   Complete by: As directed    Drink plenty of fluids.  Prune juice may be helpful.  You may use a stool softener, such as Colace (over the counter) 100 mg twice a day.  Use MiraLax (over the counter) for constipation as needed.   DO NOT drive, shower or take a tub bath until instructed by your physician   Complete by: As directed    Diet general   Complete by: As directed    Increase activity slowly as tolerated    Complete by: As directed    TED hose   Complete by: As directed    Use stockings (TED hose) for 6 weeks on both leg(s).  You may remove them at night for sleeping.   Weight bearing as tolerated   Complete by: As directed    Laterality: bilateral      Follow-up Information    Evon Slack, PA-C On 02/04/2019.   Specialties: Orthopedic Surgery, Emergency Medicine Why: at 9:15am Contact information: [address] [place] [address] [phone]        Donato Heinz, MD On 03/05/2019.   Specialty: Orthopedic Surgery Why: at 9:00am Contact information: [address] [facility] [address] [phone]            Lasandra Beech, PA-C 01/22/2019, 8:42 AM

## 2019-01-22 NOTE — Progress Notes (Signed)
Discharge instructions reviewed with patient and his wife. Verbalization of understanding received. Patient's wife administered am dose of Lovenox injections without difficulty. Patient and wife instructed to replace surgical dressing with honeycomb dressing on tomorrow and then apply the 2nd ted hose. Patient wheeled to front door for discharge to his wife.

## 2019-01-22 NOTE — Progress Notes (Signed)
Physical Therapy Treatment Patient Details Name: [name] MRN: [id number] DOB: 1950/03/11 Today's Date: 01/22/2019    History of Present Illness Pt is a 68 yo male diagnosed with degenerative arthrosis of the right knee and is s/p elective R TKA. PMH includes L TKA and asthma.    PT Comments    Pt presented with deficits in strength, transfers, mobility, gait, balance, R knee ROM, and activity tolerance but made good progress towards goals.  Pt was steady and confident with transfers and was able to amb 200' with no adverse symptoms and with R knee pain increasing from 0/10 at rest to 1-2/10.  Pt and spouse educated on proper stair sequencing with pt able to ascend and descend steps with good control and stability.  Pt will benefit from HHPT services upon discharge to safely address above deficits for decreased caregiver assistance and eventual return to PLOF.     Follow Up Recommendations  Home health PT     Equipment Recommendations  Rolling walker with 5" wheels    Recommendations for Other Services       Precautions / Restrictions Precautions Precautions: Fall;Knee Precaution Booklet Issued: Yes (comment) Precaution Comments: Pt able to perform Ind RLE SLR without ext lag, no KI required Restrictions Weight Bearing Restrictions: Yes RLE Weight Bearing: Weight bearing as tolerated    Mobility  Bed Mobility Overal bed mobility: Modified Independent             General bed mobility comments: Extra time and effort required  Transfers Overall transfer level: Needs assistance Equipment used: Rolling walker (2 wheeled) Transfers: Sit to/from Stand Sit to Stand: Supervision         General transfer comment: Min verbal cues for sequencing for R foot positioning and hand placement  Ambulation/Gait Ambulation/Gait assistance: Supervision Gait Distance (Feet): 200 Feet Assistive device: Rolling walker (2 wheeled) Gait Pattern/deviations: Step-to  pattern;Antalgic;Decreased step length - left;Decreased stance time - right Gait velocity: decreased   General Gait Details: Slow, antalgic, step-to gait pattern but steady without LOB or buckling with mod verbal cues for decreasd BUE lean on the RW   Stairs Stairs: Yes Stairs assistance: Min guard Stair Management: Two rails Number of Stairs: 4 General stair comments: Visual demonstration with pt and spouse present follow by pt ascending/descending 4 steps with B rails with good control and stability   Wheelchair Mobility    Modified Rankin (Stroke Patients Only)       Balance Overall balance assessment: Needs assistance   Sitting balance-Leahy Scale: Normal     Standing balance support: Bilateral upper extremity supported Standing balance-Leahy Scale: Good                              Cognition Arousal/Alertness: Awake/alert Behavior During Therapy: WFL for tasks assessed/performed Overall Cognitive Status: Within Functional Limits for tasks assessed                                        Exercises Total Joint Exercises Ankle Circles/Pumps: AROM;Strengthening;Both;10 reps Quad Sets: Strengthening;AROM;Right;10 reps;15 reps Heel Slides: AROM;Strengthening;Right;10 reps Hip ABduction/ADduction: AROM;Strengthening;Right;10 reps Straight Leg Raises: AROM;Strengthening;Right;10 reps Long Arc Quad: Strengthening;AROM;Right;15 reps;10 reps Knee Flexion: AROM;Strengthening;Right;10 reps;15 reps Marching in Standing: AROM;10 reps;Standing;Both  R knee AROM: 8-86 deg  Other Exercises: 90 deg R turn training to prevent CKC twisting on the R  knee Other Exercises: HEP review per handout with pt/spouse Other Exercises: Pt and spouse educated on RLE positioning Other Exercises: Pt and spouse educated on car transfer sequencing Other Exercises: Pt and spouse educated on general principles of activity progression    General Comments         Pertinent Vitals/Pain Pain Assessment: No/denies pain    Home Living Family/patient expects to be discharged to:: Private residence Living Arrangements: Spouse/significant other Available Help at Discharge: Family;Available 24 hours/day Type of Home: House Home Access: Ramped entrance   Home Layout: One level Home Equipment: Cane - single point;Bedside commode      Prior Function Level of Independence: Independent      Comments: Pt is active, works outdoors as a Educational psychologist, Ind with amb community distances without an AD, Ind with ADLs, one fall in the last 6 months secondary to tripping   PT Goals (current goals can now be found in the care plan section) Acute Rehab 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 Progress towards PT goals: Progressing toward goals    Frequency    BID      PT Plan Current plan remains appropriate    Co-evaluation              AM-PAC PT "6 Clicks" Mobility   Outcome Measure  Help needed turning from your back to your side while in a flat bed without using bedrails?: A Little Help needed moving from lying on your back to sitting on the side of a flat bed without using bedrails?: A Little Help needed moving to and from a bed to a chair (including a wheelchair)?: A Little Help needed standing up from a chair using your arms (e.g., wheelchair or bedside chair)?: A Little Help needed to walk in hospital room?: A Little Help needed climbing 3-5 steps with a railing? : A Little 6 Click Score: 18    End of Session Equipment Utilized During Treatment: Gait belt Activity Tolerance: Patient tolerated treatment well Patient left: in bed;with nursing/sitter in room;with call bell/phone within reach;with family/visitor present;Other (comment)(Nursing in room preparing pt for discharge at end of session) Nurse Communication: Mobility status PT Visit Diagnosis: Muscle weakness (generalized) (M62.81);Other abnormalities of gait and  mobility (R26.89)     Time: 4166-0630 PT Time Calculation (min) (ACUTE ONLY): 38 min  Charges:  $Gait Training: 8-22 mins $Therapeutic Exercise: 8-22 mins $Therapeutic Activity: 8-22 mins                     D. Scott Winry Egnew PT, DPT 01/22/19, 11:41 AM

## 2019-01-22 NOTE — Anesthesia Postprocedure Evaluation (Signed)
Anesthesia Post Note  Patient: [name]  Procedure(s) Performed: COMPUTER ASSISTED TOTAL KNEE ARTHROPLASTY RIGHT (Right Knee)  Patient location during evaluation: Nursing Unit Anesthesia Type: Spinal Level of consciousness: awake, awake and alert and oriented Pain management: pain level controlled Vital Signs Assessment: post-procedure vital signs reviewed and stable Respiratory status: spontaneous breathing, nonlabored ventilation and respiratory function stable Cardiovascular status: blood pressure returned to baseline and stable Postop Assessment: no headache and no backache Anesthetic complications: no     Last Vitals:  Vitals:   01/22/19 0739 01/22/19 0858  BP: 118/68   Pulse: 79   Resp: 18   Temp: 36.8 C   SpO2: 95% 97%    Last Pain:  Vitals:   01/22/19 0739  TempSrc: Oral  PainSc:                  Johnna Acosta

## 2021-03-11 IMAGING — DX DG KNEE 1-2V PORT*R*
2 series · 2 of 2 positions shown · non-contrast
Comparison: None.

CLINICAL DATA: Status post right total knee replacement today.

EXAM:
PORTABLE RIGHT KNEE - 1-2 VIEW

[knee ap]
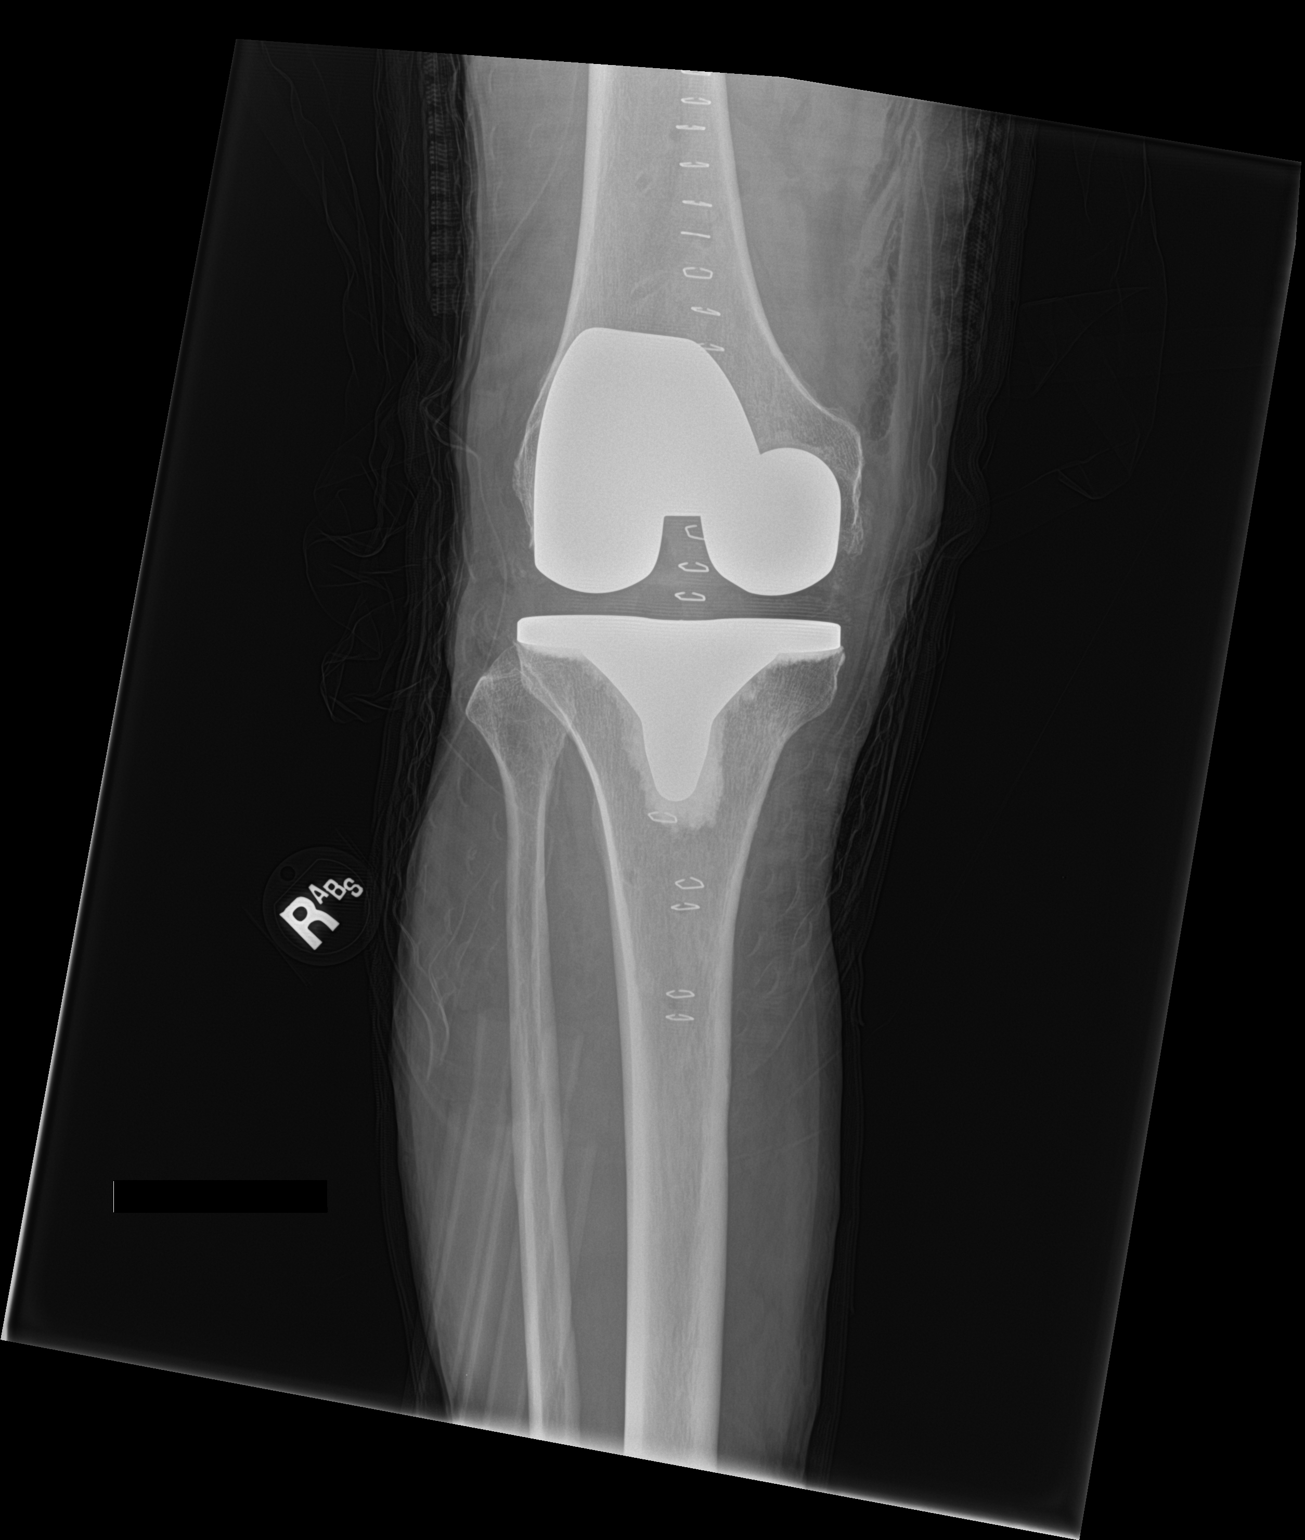

[knee lat]
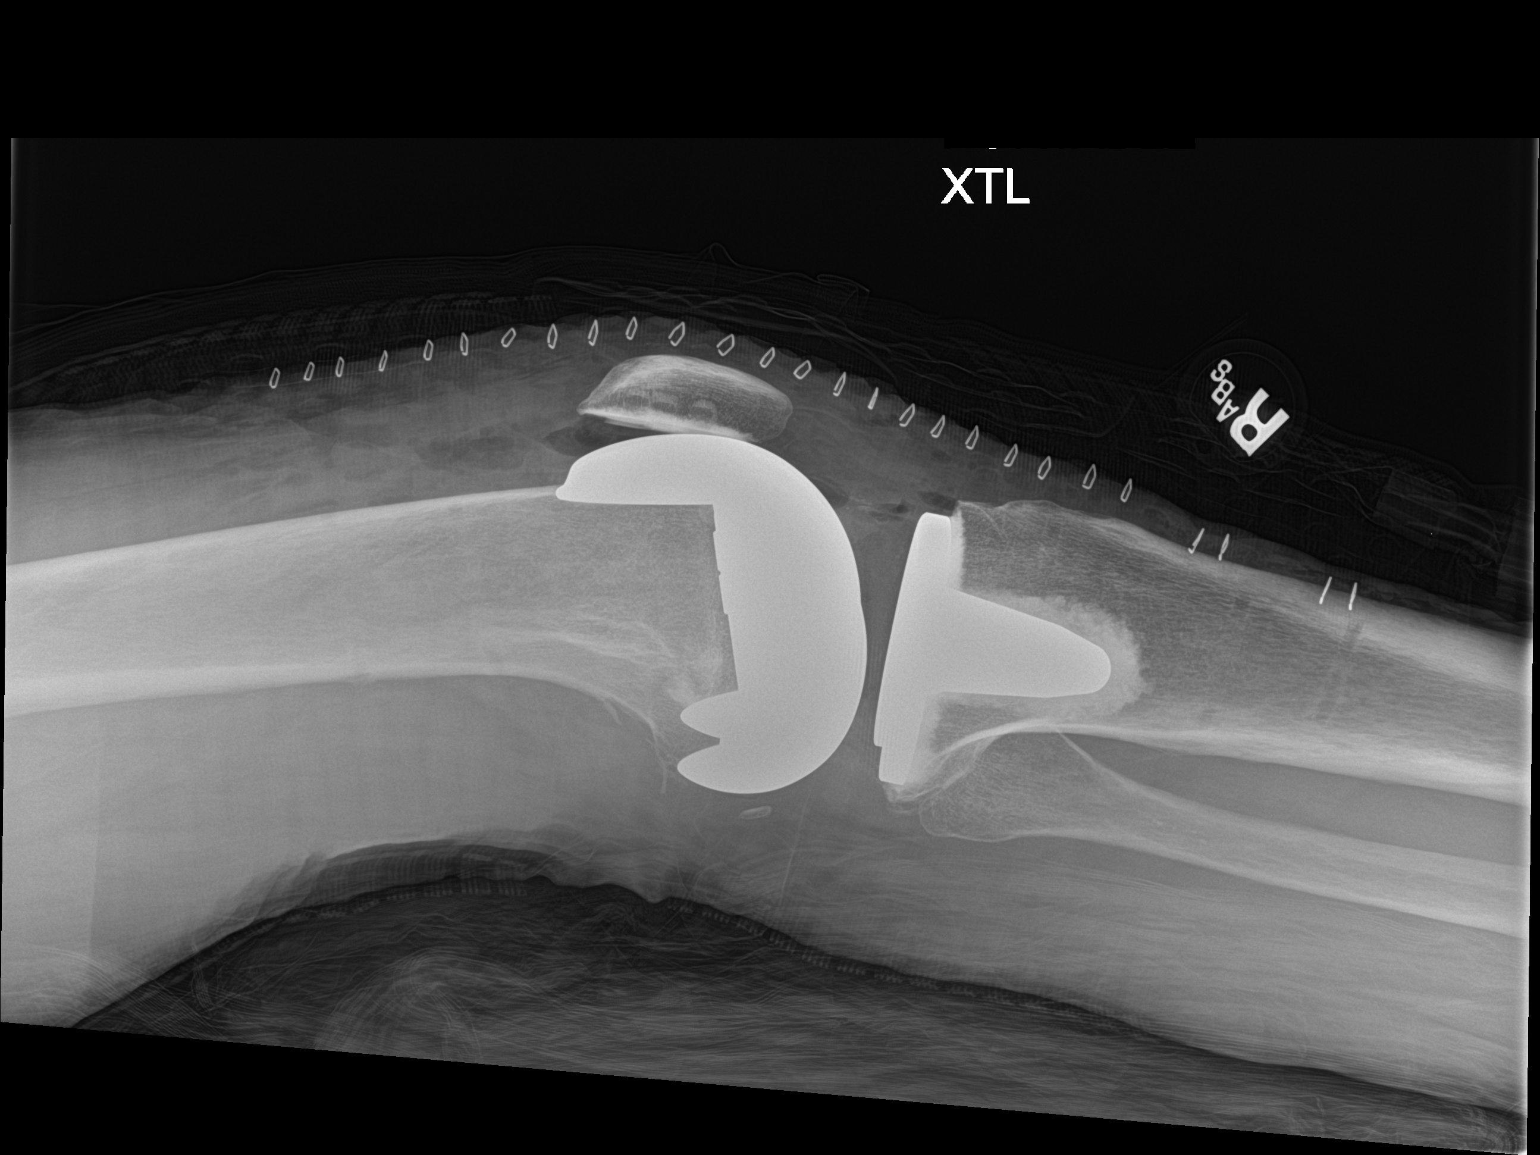

[2 of 2 positions shown; findings below may reference images not displayed]

FINDINGS: Total knee arthroplasty is in place. There is some gas in the soft
tissues from surgery and surgical staples are noted. No acute
abnormality is identified. Hardware is normal in appearance.
IMPRESSION: Status post right knee replacement.  No acute finding.

## 2021-08-03 ENCOUNTER — Encounter: Payer: Self-pay | Admitting: Ophthalmology

## 2021-08-14 NOTE — Discharge Instructions (Signed)

## 2021-08-15 ENCOUNTER — Ambulatory Visit
Admission: RE | Admit: 2021-08-15 | Discharge: 2021-08-15 | Disposition: A | Discharge status: Home / Self Care | Payer: Medicare HMO | Attending: Ophthalmology | Admitting: Ophthalmology

## 2021-08-15 ENCOUNTER — Encounter: Payer: Self-pay | Admitting: Ophthalmology

## 2021-08-15 ENCOUNTER — Encounter
Admission: RE | Disposition: A | Discharge status: Home / Self Care | Payer: Self-pay | Source: Home / Self Care | Attending: Ophthalmology

## 2021-08-15 ENCOUNTER — Other Ambulatory Visit: Payer: Self-pay

## 2021-08-15 ENCOUNTER — Ambulatory Visit: Payer: Medicare HMO | Admitting: Anesthesiology

## 2021-08-15 DIAGNOSIS — H2511 Age-related nuclear cataract, right eye: Secondary | ICD-10-CM | POA: Diagnosis present

## 2021-08-15 DIAGNOSIS — J45909 Unspecified asthma, uncomplicated: Secondary | ICD-10-CM | POA: Insufficient documentation

## 2021-08-15 HISTORY — PX: CATARACT EXTRACTION W/PHACO: SHX586

## 2021-08-15 SURGERY — PHACOEMULSIFICATION, CATARACT, WITH IOL INSERTION
Anesthesia: Monitor Anesthesia Care | Site: Eye | Laterality: Right

## 2021-08-15 MED ORDER — MOXIFLOXACIN HCL 0.5 % OP SOLN
OPHTHALMIC | Status: DC | PRN
  Administered 2021-08-15: 0.2 mL via OPHTHALMIC

## 2021-08-15 MED ORDER — SIGHTPATH DOSE#1 BSS IO SOLN
INTRAOCULAR | Status: DC | PRN
  Administered 2021-08-15: 78 mL via OPHTHALMIC

## 2021-08-15 MED ORDER — SIGHTPATH DOSE#1 BSS IO SOLN
INTRAOCULAR | Status: DC | PRN
  Administered 2021-08-15: 15 mL

## 2021-08-15 MED ORDER — MIDAZOLAM HCL 2 MG/2ML IJ SOLN
INTRAMUSCULAR | Status: DC | PRN
  Administered 2021-08-15: 1 mg via INTRAVENOUS
  Administered 2021-08-15: .5 mg via INTRAVENOUS

## 2021-08-15 MED ORDER — TETRACAINE HCL 0.5 % OP SOLN
1.0000 [drp] | OPHTHALMIC | Status: DC | PRN
  Administered 2021-08-15 (×2): 1 [drp] via OPHTHALMIC

## 2021-08-15 MED ORDER — SIGHTPATH DOSE#1 NA CHONDROIT SULF-NA HYALURON 40-17 MG/ML IO SOLN
INTRAOCULAR | Status: DC | PRN
  Administered 2021-08-15: 1 mL via INTRAOCULAR

## 2021-08-15 MED ORDER — ARMC OPHTHALMIC DILATING DROPS
1.0000 | OPHTHALMIC | Status: DC | PRN
  Administered 2021-08-15 (×3): 1 via OPHTHALMIC

## 2021-08-15 MED ORDER — LACTATED RINGERS IV SOLN: INTRAVENOUS | Status: DC

## 2021-08-15 MED ORDER — SIGHTPATH DOSE#1 BSS IO SOLN
INTRAOCULAR | Status: DC | PRN
  Administered 2021-08-15: 1 mL via INTRAMUSCULAR

## 2021-08-15 MED ORDER — FENTANYL CITRATE (PF) 100 MCG/2ML IJ SOLN
INTRAMUSCULAR | Status: DC | PRN
Start: 2021-08-15 — End: 2021-08-15
  Administered 2021-08-15: 50 ug via INTRAVENOUS

## 2021-08-15 MED ORDER — BRIMONIDINE TARTRATE-TIMOLOL 0.2-0.5 % OP SOLN
OPHTHALMIC | Status: DC | PRN
  Administered 2021-08-15: 1 [drp] via OPHTHALMIC

## 2021-08-15 SURGICAL SUPPLY — 12 items
CATARACT SUITE SIGHTPATH (MISCELLANEOUS) ×2 IMPLANT
FEE CATARACT SUITE SIGHTPATH (MISCELLANEOUS) ×1 IMPLANT
GLOVE SURG ENC TEXT LTX SZ8 (GLOVE) ×2 IMPLANT
GLOVE SURG TRIUMPH 8.0 PF LTX (GLOVE) ×2 IMPLANT
LENS IOL ACRSF VT TRC 315 21.5 IMPLANT
LENS IOL ACRYSOF VIVITY 21.5 ×2 IMPLANT
LENS IOL VIVITY 315 21.5 ×1 IMPLANT
NDL FILTER BLUNT 18X1 1/2 (NEEDLE) ×1 IMPLANT
NEEDLE FILTER BLUNT 18X 1/2SAF (NEEDLE) ×1
NEEDLE FILTER BLUNT 18X1 1/2 (NEEDLE) ×1 IMPLANT
SYR 3ML LL SCALE MARK (SYRINGE) ×2 IMPLANT
WATER STERILE IRR 250ML POUR (IV SOLUTION) ×2 IMPLANT

## 2021-08-15 NOTE — Anesthesia Postprocedure Evaluation (Signed)
Anesthesia Post Note  Patient: [name]  Procedure(s) Performed: CATARACT EXTRACTION PHACO AND INTRAOCULAR LENS PLACEMENT (IOC) RIGHT vivity toric 8.82 00:52.3 (Right: Eye)     Patient location during evaluation: PACU Anesthesia Type: MAC Level of consciousness: awake and alert Pain management: pain level controlled Vital Signs Assessment: post-procedure vital signs reviewed and stable Respiratory status: spontaneous breathing Cardiovascular status: stable Anesthetic complications: no   No notable events documented.  Gillian Scarce

## 2021-08-15 NOTE — Anesthesia Preprocedure Evaluation (Signed)
Anesthesia Evaluation  Patient identified by MRN, date of birth, ID band Patient awake    Reviewed: Allergy & Precautions, H&P , NPO status , Patient's Chart, lab work & pertinent test results  Airway Mallampati: II  TM Distance: >3 FB Neck ROM: full    Dental no notable dental hx.    Pulmonary asthma ,    Pulmonary exam normal        Cardiovascular negative cardio ROS Normal cardiovascular exam     Neuro/Psych negative neurological ROS  negative psych ROS   GI/Hepatic Neg liver ROS, Medicated,  Endo/Other  negative endocrine ROS  Renal/GU negative Renal ROS  negative genitourinary   Musculoskeletal   Abdominal   Peds  Hematology negative hematology ROS (+)   Anesthesia Other Findings   Reproductive/Obstetrics                             Anesthesia Physical Anesthesia Plan  ASA: 2  Anesthesia Plan: MAC   Post-op Pain Management:    Induction:   PONV Risk Score and Plan: 1 and TIVA, Midazolam and Treatment may vary due to age or medical condition  Airway Management Planned:   Additional Equipment:   Intra-op Plan:   Post-operative Plan:   Informed Consent: I have reviewed the patients History and Physical, chart, labs and discussed the procedure including the risks, benefits and alternatives for the proposed anesthesia with the patient or authorized representative who has indicated his/her understanding and acceptance.       Plan Discussed with:   Anesthesia Plan Comments:         Anesthesia Quick Evaluation

## 2021-08-15 NOTE — Anesthesia Procedure Notes (Signed)
Procedure Name: MAC Date/Time: 08/15/2021 1:11 PM  Performed by: Dionne Bucy, CRNAPre-anesthesia Checklist: Patient identified, Emergency Drugs available, Suction available, Patient being monitored and Timeout performed Patient Re-evaluated:Patient Re-evaluated prior to induction Oxygen Delivery Method: Nasal cannula Placement Confirmation: positive ETCO2

## 2021-08-15 NOTE — H&P (Signed)
[facility]   Primary Care Physician:  Lauro Regulus, MD Ophthalmologist: Dr. Druscilla Brownie  Pre-Procedure History & Physical: HPI:  [name] is a 71 y.o. male here for cataract surgery.   Past Medical History:  Diagnosis Date   Arthritis    Asthma    GERD (gastroesophageal reflux disease)     Past Surgical History:  Procedure Laterality Date   JOINT REPLACEMENT Left    TKR   KNEE ARTHROPLASTY Right 01/21/2019   Procedure: COMPUTER ASSISTED TOTAL KNEE ARTHROPLASTY RIGHT;  Surgeon: Donato Heinz, MD;  Location: [facility] ORS;  Service: Orthopedics;  Laterality: Right;   KNEE CARTILAGE SURGERY Bilateral     Prior to Admission medications   Medication Sig Start Date End Date Taking? Authorizing Provider  acidophilus (RISAQUAD) CAPS capsule Take 1 capsule by mouth daily.   Yes [provider]  ADVAIR DISKUS 250-50 MCG/DOSE AEPB Inhale 1 puff into the lungs daily. 11/30/18  Yes [provider]  albuterol (VENTOLIN HFA) 108 (90 Base) MCG/ACT inhaler Inhale 2 puffs into the lungs every 4 (four) hours as needed for wheezing or shortness of breath.  08/04/12  Yes [provider]  CANNABIDIOL PO Take 30 mg by mouth daily.   Yes [provider]  esomeprazole (NEXIUM) 40 MG capsule Take 40 mg by mouth daily. 11/06/18  Yes [provider]  fluticasone (FLONASE) 50 MCG/ACT nasal spray Place 2 sprays into both nostrils daily as needed for allergies or rhinitis.   Yes [provider]  meloxicam (MOBIC) 7.5 MG tablet Take 7.5 mg by mouth daily. 11/06/18  Yes [provider]  montelukast (SINGULAIR) 10 MG tablet Take 10 mg by mouth daily. 11/06/18  Yes [provider]  tamsulosin (FLOMAX) 0.4 MG CAPS capsule Take 0.4 mg by mouth daily. 10/22/18  Yes [provider]  Turmeric 500 MG CAPS Take 500 mg by mouth daily.   Yes [provider]  traMADol (ULTRAM) 50 MG tablet Take 1 tablet (50 mg total) by mouth  every 6 (six) hours as needed for moderate pain. Patient not taking: Reported on 08/03/2021 01/21/19   Donato Heinz, MD    Allergies as of 04/04/2021   (No Known Allergies)    History reviewed. No pertinent family history.  Social History   Socioeconomic History   Marital status: Married    Spouse name: Not on file   Number of children: Not on file   Years of education: Not on file   Highest education level: Not on file  Occupational History   Not on file  Tobacco Use   Smoking status: Never   Smokeless tobacco: Never  Vaping Use   Vaping Use: Never used  Substance and Sexual Activity   Alcohol use: Never   Drug use: Never   Sexual activity: Not on file  Other Topics Concern   Not on file  Social History Narrative   Not on file   Social Determinants of Health   Financial Resource Strain: Not on file  Food Insecurity: Not on file  Transportation Needs: Not on file  Physical Activity: Not on file  Stress: Not on file  Social Connections: Not on file  Intimate Partner Violence: Not on file    Review of Systems: See HPI, otherwise negative ROS  Physical Exam: BP 132/78   Pulse 60   Temp 98.4 F (36.9 C)   Ht 5\' 8"  (1.727 m)   Wt 75.3 kg   SpO2 96%  BMI 25.24 kg/m  General:   Alert, cooperative in NAD Head:  Normocephalic and atraumatic. Respiratory:  Normal work of breathing. Cardiovascular:  RRR  Impression/Plan: [name] is here for cataract surgery.  Risks, benefits, limitations, and alternatives regarding cataract surgery have been reviewed with the patient.  Questions have been answered.  All parties agreeable.   Galen Manila, MD  08/15/2021, 12:57 PM

## 2021-08-15 NOTE — Transfer of Care (Signed)
Immediate Anesthesia Transfer of Care Note  Patient: [name]  Procedure(s) Performed: CATARACT EXTRACTION PHACO AND INTRAOCULAR LENS PLACEMENT (IOC) RIGHT vivity toric 8.82 00:52.3 (Right: Eye)  Patient Location: PACU  Anesthesia Type: MAC  Level of Consciousness: awake, alert  and patient cooperative  Airway and Oxygen Therapy: Patient Spontanous Breathing and Patient connected to supplemental oxygen  Post-op Assessment: Post-op Vital signs reviewed, Patient's Cardiovascular Status Stable, Respiratory Function Stable, Patent Airway and No signs of Nausea or vomiting  Post-op Vital Signs: Reviewed and stable  Complications: No notable events documented.

## 2021-08-15 NOTE — Op Note (Signed)
PREOPERATIVE DIAGNOSIS:  Nuclear sclerotic cataract of the right eye.   POSTOPERATIVE DIAGNOSIS:  Nuclear sclerotic cataract of the right eye.   OPERATIVE PROCEDURE: Procedure(s): CATARACT EXTRACTION PHACO AND INTRAOCULAR LENS PLACEMENT (IOC) RIGHT vivity toric 8.82 00:52.3   SURGEON:  Jacob Manila, MD.   ANESTHESIA: 1.      Managed anesthesia care. 2.     0.53ml of Shugarcaine was instilled following the paracentesis  Anesthesiologist: Jolayne Panther, MD CRNA: Lily Kocher, CRNA  COMPLICATIONS:  None.   TECHNIQUE:   Stop and chop    DESCRIPTION OF PROCEDURE:  The patient was examined and consented in the preoperative holding area where the aforementioned topical anesthesia was applied to the right eye.  The patient was brought back to the Operating Room where he was sat upright on the gurney and given a target to fixate upon while the eye was marked at the 3:00 and 9:00 position.  The patient was then reclined on the operating table.  The eye was prepped and draped in the usual sterile ophthalmic fashion and a lid speculum was placed. A paracentesis was created with the side port blade and the anterior chamber was filled with viscoelastic. A near clear corneal incision was performed with the steel keratome. A continuous curvilinear capsulorrhexis was performed with a cystotome followed by the capsulorrhexis forceps. Hydrodissection and hydrodelineation were carried out with BSS on a blunt cannula. The lens was removed in a stop and chop technique and the remaining cortical material was removed with the irrigation-aspiration handpiece. The eye was inflated with viscoelastic and the DFT  lens  was placed in the eye and rotated to within a few degrees of the predetermined orientation.  The remaining viscoelastic was removed from the eye.  The Sinskey hook was used to rotate the toric lens into its final resting place at 023 degrees.  0. The eye was inflated to a physiologic pressure and found  to be watertight. 0.77ml of Vigamox was placed in the anterior chamber.  The eye was dressed with Vigamox.and Combigan The patient was given protective glasses to wear throughout the day and a shield with which to sleep tonight. The patient was also given drops with which to begin a drop regimen today and will follow-up with me in one day. Implant Name Type Inv. Item Serial No. Manufacturer Lot No. LRB No. Used Action  LENS IOL ACRYSOF VIVITY 21.5 - [id number]  LENS IOL ACRYSOF VIVITY 21.5 [id number] [employer]  Right 1 Implanted   Procedure(s): CATARACT EXTRACTION PHACO AND INTRAOCULAR LENS PLACEMENT (IOC) RIGHT vivity toric 8.82 00:52.3 (Right)  Electronically signed: Galen Pierce 08/15/2021 1:37 PM

## 2021-08-16 ENCOUNTER — Other Ambulatory Visit: Payer: Self-pay

## 2021-08-16 ENCOUNTER — Encounter: Payer: Self-pay | Admitting: Ophthalmology

## 2021-08-24 NOTE — Discharge Instructions (Signed)

## 2021-08-28 NOTE — Anesthesia Preprocedure Evaluation (Signed)
Anesthesia Evaluation  Patient identified by MRN, date of birth, ID band Patient awake    Reviewed: Allergy & Precautions, H&P , NPO status , Patient's Chart, lab work & pertinent test results  Airway Mallampati: II  TM Distance: >3 FB Neck ROM: full    Dental no notable dental hx.    Pulmonary asthma ,    Pulmonary exam normal        Cardiovascular negative cardio ROS Normal cardiovascular exam     Neuro/Psych negative neurological ROS  negative psych ROS   GI/Hepatic Neg liver ROS, GERD  Controlled,  Endo/Other  negative endocrine ROS  Renal/GU negative Renal ROS  negative genitourinary   Musculoskeletal   Abdominal   Peds  Hematology negative hematology ROS (+)   Anesthesia Other Findings   Reproductive/Obstetrics                            Anesthesia Physical  Anesthesia Plan  ASA: 2  Anesthesia Plan: MAC   Post-op Pain Management:    Induction:   PONV Risk Score and Plan: 1 and TIVA, Midazolam and Treatment may vary due to age or medical condition  Airway Management Planned:   Additional Equipment:   Intra-op Plan:   Post-operative Plan:   Informed Consent: I have reviewed the patients History and Physical, chart, labs and discussed the procedure including the risks, benefits and alternatives for the proposed anesthesia with the patient or authorized representative who has indicated his/her understanding and acceptance.     Dental advisory given  Plan Discussed with: CRNA  Anesthesia Plan Comments:        Anesthesia Quick Evaluation

## 2021-08-29 ENCOUNTER — Ambulatory Visit (AMBULATORY_SURGERY_CENTER): Payer: Medicare HMO | Admitting: Anesthesiology

## 2021-08-29 ENCOUNTER — Other Ambulatory Visit: Payer: Self-pay

## 2021-08-29 ENCOUNTER — Ambulatory Visit: Payer: Medicare HMO | Admitting: Anesthesiology

## 2021-08-29 ENCOUNTER — Encounter: Payer: Self-pay | Admitting: Ophthalmology

## 2021-08-29 ENCOUNTER — Encounter
Admission: RE | Disposition: A | Discharge status: Home / Self Care | Payer: Self-pay | Source: Home / Self Care | Attending: Ophthalmology

## 2021-08-29 ENCOUNTER — Ambulatory Visit
Admission: RE | Admit: 2021-08-29 | Discharge: 2021-08-29 | Disposition: A | Discharge status: Home / Self Care | Payer: Medicare HMO | Attending: Ophthalmology | Admitting: Ophthalmology

## 2021-08-29 DIAGNOSIS — K219 Gastro-esophageal reflux disease without esophagitis: Secondary | ICD-10-CM | POA: Insufficient documentation

## 2021-08-29 DIAGNOSIS — H2512 Age-related nuclear cataract, left eye: Secondary | ICD-10-CM | POA: Diagnosis present

## 2021-08-29 DIAGNOSIS — J45909 Unspecified asthma, uncomplicated: Secondary | ICD-10-CM

## 2021-08-29 HISTORY — PX: CATARACT EXTRACTION W/PHACO: SHX586

## 2021-08-29 SURGERY — PHACOEMULSIFICATION, CATARACT, WITH IOL INSERTION
Anesthesia: Monitor Anesthesia Care | Site: Eye | Laterality: Left

## 2021-08-29 MED ORDER — SIGHTPATH DOSE#1 BSS IO SOLN
INTRAOCULAR | Status: DC | PRN
  Administered 2021-08-29: 1 mL via INTRAMUSCULAR

## 2021-08-29 MED ORDER — TETRACAINE HCL 0.5 % OP SOLN
1.0000 [drp] | OPHTHALMIC | Status: DC | PRN
  Administered 2021-08-29 (×3): 1 [drp] via OPHTHALMIC

## 2021-08-29 MED ORDER — SIGHTPATH DOSE#1 NA CHONDROIT SULF-NA HYALURON 40-17 MG/ML IO SOLN
INTRAOCULAR | Status: DC | PRN
  Administered 2021-08-29: 1 mL via INTRAOCULAR

## 2021-08-29 MED ORDER — FENTANYL CITRATE (PF) 100 MCG/2ML IJ SOLN
INTRAMUSCULAR | Status: DC | PRN
  Administered 2021-08-29: 50 ug via INTRAVENOUS

## 2021-08-29 MED ORDER — MOXIFLOXACIN HCL 0.5 % OP SOLN
OPHTHALMIC | Status: DC | PRN
  Administered 2021-08-29: 0.2 mL via OPHTHALMIC

## 2021-08-29 MED ORDER — ARMC OPHTHALMIC DILATING DROPS
1.0000 | OPHTHALMIC | Status: DC | PRN
  Administered 2021-08-29 (×3): 1 via OPHTHALMIC

## 2021-08-29 MED ORDER — SIGHTPATH DOSE#1 BSS IO SOLN
INTRAOCULAR | Status: DC | PRN
  Administered 2021-08-29: 15 mL

## 2021-08-29 MED ORDER — MIDAZOLAM HCL 2 MG/2ML IJ SOLN
INTRAMUSCULAR | Status: DC | PRN
  Administered 2021-08-29: 1 mg via INTRAVENOUS

## 2021-08-29 MED ORDER — SIGHTPATH DOSE#1 BSS IO SOLN
INTRAOCULAR | Status: DC | PRN
  Administered 2021-08-29: 81 mL via OPHTHALMIC

## 2021-08-29 MED ORDER — BRIMONIDINE TARTRATE-TIMOLOL 0.2-0.5 % OP SOLN
OPHTHALMIC | Status: DC | PRN
  Administered 2021-08-29: 1 [drp] via OPHTHALMIC

## 2021-08-29 SURGICAL SUPPLY — 12 items
CATARACT SUITE SIGHTPATH (MISCELLANEOUS) ×2 IMPLANT
FEE CATARACT SUITE SIGHTPATH (MISCELLANEOUS) ×1 IMPLANT
GLOVE SURG ENC TEXT LTX SZ8 (GLOVE) ×2 IMPLANT
GLOVE SURG TRIUMPH 8.0 PF LTX (GLOVE) ×2 IMPLANT
LENS IOL ACRSF VT TRC 315 21.0 IMPLANT
LENS IOL ACRYSOF VIVITY 21.0 ×2 IMPLANT
LENS IOL VIVITY 315 21.0 ×1 IMPLANT
NDL FILTER BLUNT 18X1 1/2 (NEEDLE) ×1 IMPLANT
NEEDLE FILTER BLUNT 18X 1/2SAF (NEEDLE) ×1
NEEDLE FILTER BLUNT 18X1 1/2 (NEEDLE) ×1 IMPLANT
SYR 3ML LL SCALE MARK (SYRINGE) ×2 IMPLANT
WATER STERILE IRR 250ML POUR (IV SOLUTION) ×2 IMPLANT

## 2021-08-29 NOTE — Transfer of Care (Signed)
Immediate Anesthesia Transfer of Care Note  Patient: [name]  Procedure(s) Performed: CATARACT EXTRACTION PHACO AND INTRAOCULAR LENS PLACEMENT (IOC) LEFT Vivity toric (Left: Eye)  Patient Location: PACU  Anesthesia Type:MAC  Level of Consciousness: drowsy and patient cooperative  Airway & Oxygen Therapy: Patient Spontanous Breathing  Post-op Assessment: Report given to RN and Post -op Vital signs reviewed and stable  Post vital signs: Reviewed and stable  Last Vitals:  Vitals Value Taken Time  BP    Temp    Pulse 48 08/29/21 1032  Resp 13 08/29/21 1032  SpO2 97 % 08/29/21 1032  Vitals shown include unvalidated device data.  Last Pain:  Vitals:   08/29/21 0855  TempSrc: Temporal  PainSc: 0-No pain         Complications: No notable events documented.

## 2021-08-29 NOTE — H&P (Signed)
[facility]   Primary Care Physician:  Lauro Regulus, MD Ophthalmologist: Dr. Druscilla Brownie  Pre-Procedure History & Physical: HPI:  [name] is a 71 y.o. male here for cataract surgery.   Past Medical History:  Diagnosis Date   Arthritis    Asthma    GERD (gastroesophageal reflux disease)     Past Surgical History:  Procedure Laterality Date   CATARACT EXTRACTION W/PHACO Right 08/15/2021   Procedure: CATARACT EXTRACTION PHACO AND INTRAOCULAR LENS PLACEMENT (IOC) RIGHT vivity toric 8.82 00:52.3;  Surgeon: Galen Manila, MD;  Location: [facility];  Service: Ophthalmology;  Laterality: Right;   JOINT REPLACEMENT Left    TKR   KNEE ARTHROPLASTY Right 01/21/2019   Procedure: COMPUTER ASSISTED TOTAL KNEE ARTHROPLASTY RIGHT;  Surgeon: Donato Heinz, MD;  Location: [facility] OR[facility];  Service: Orthopedics;  Laterality: Right;   KNEE CARTILAGE SURGERY Bilateral     Prior to Admission medications   Medication Sig Start Date End Date Taking? Authorizing Provider  acidophilus (RISAQUAD) CAPS capsule Take 1 capsule by mouth daily.   Yes [provider]  ADVAIR DISKUS 250-50 MCG/DOSE AEPB Inhale 1 puff into the lungs daily. 11/30/18  Yes [provider]  albuterol (VENTOLIN HFA) 108 (90 Base) MCG/ACT inhaler Inhale 2 puffs into the lungs every 4 (four) hours as needed for wheezing or shortness of breath.  08/04/12  Yes [provider]  CANNABIDIOL PO Take 30 mg by mouth daily.   Yes [provider]  esomeprazole (NEXIUM) 40 MG capsule Take 40 mg by mouth daily. 11/06/18  Yes [provider]  fluticasone (FLONASE) 50 MCG/ACT nasal spray Place 2 sprays into both nostrils daily as needed for allergies or rhinitis.   Yes [provider]  meloxicam (MOBIC) 7.5 MG tablet Take 7.5 mg by mouth daily. 11/06/18  Yes [provider]  montelukast (SINGULAIR) 10 MG tablet Take 10 mg by mouth daily. 11/06/18  Yes [provider]  tamsulosin (FLOMAX) 0.4 MG CAPS capsule Take 0.4 mg by mouth daily. 10/22/18  Yes [provider]  traMADol (ULTRAM) 50 MG tablet Take 1 tablet (50 mg total) by mouth every 6 (six) hours as needed for moderate pain. 01/21/19  Yes Hooten, Illene Labrador, MD  Turmeric 500 MG CAPS Take 500 mg by mouth daily.   Yes [provider]    Allergies as of 04/04/2021   (No Known Allergies)    History reviewed. No pertinent family history.  Social History   Socioeconomic History   Marital status: Married    Spouse name: Not on file   Number of children: Not on file   Years of education: Not on file   Highest education level: Not on file  Occupational History   Not on file  Tobacco Use   Smoking status: Never   Smokeless tobacco: Never  Vaping Use   Vaping Use: Never used  Substance and Sexual Activity   Alcohol use: Never   Drug use: Never   Sexual activity: Not on file  Other Topics Concern   Not on file  Social History Narrative   Not on file   Social Determinants of Health   Financial Resource Strain: Not on file  Food Insecurity: Not on file  Transportation Needs: Not on file  Physical Activity: Not on file  Stress: Not on file  Social Connections: Not on file  Intimate Partner Violence: Not on file    Review of Systems: See HPI, otherwise negative ROS  Physical Exam: BP 126/79   Pulse (!) 48   Temp 98.1 F (36.7 C) (Temporal)   Resp 18   Wt 76.3 kg   SpO2 98%   BMI 25.59 kg/m  General:   Alert, cooperative in NAD Head:  Normocephalic and atraumatic. Respiratory:  Normal work of breathing. Cardiovascular:  RRR  Impression/Plan: [name] is here for cataract surgery.  Risks, benefits, limitations, and alternatives regarding cataract surgery have been reviewed with the patient.  Questions have been answered.  All parties agreeable.   Galen Manila, MD  08/29/2021, 9:59 AM

## 2021-08-29 NOTE — Anesthesia Postprocedure Evaluation (Signed)
Anesthesia Post Note  Patient: [name]  Procedure(s) Performed: CATARACT EXTRACTION PHACO AND INTRAOCULAR LENS PLACEMENT (IOC) LEFT Vivity toric (Left: Eye)     Patient location during evaluation: PACU Anesthesia Type: MAC Level of consciousness: awake and alert Pain management: pain level controlled Vital Signs Assessment: post-procedure vital signs reviewed and stable Respiratory status: spontaneous breathing, nonlabored ventilation and respiratory function stable Cardiovascular status: blood pressure returned to baseline and stable Postop Assessment: no apparent nausea or vomiting Anesthetic complications: no   No notable events documented.  [place] Ouch

## 2021-08-29 NOTE — Op Note (Signed)
PREOPERATIVE DIAGNOSIS:  Nuclear sclerotic cataract of the left eye.   POSTOPERATIVE DIAGNOSIS:  Nuclear sclerotic cataract of the left eye.   OPERATIVE PROCEDURE: Procedure(s): CATARACT EXTRACTION PHACO AND INTRAOCULAR LENS PLACEMENT (IOC) LEFT Vivity toric   SURGEON:  Galen Manila, MD.   ANESTHESIA: 1.      Managed anesthesia care. 2.     0.67ml os Shugarcaine was instilled following the paracentesis [email or web address]@   COMPLICATIONS:  None.   TECHNIQUE:   Stop and chop    DESCRIPTION OF PROCEDURE:  The patient was examined and consented in the preoperative holding area where the aforementioned topical anesthesia was applied to the left eye.  The patient was brought back to the Operating Room where he was sat upright on the gurney and given a target to fixate upon while the eye was marked at the 3:00 and 9:00 position.  The patient was then reclined on the operating table.  The eye was prepped and draped in the usual sterile ophthalmic fashion and a lid speculum was placed. A paracentesis was created with the side port blade and the anterior chamber was filled with viscoelastic. A near clear corneal incision was performed with the steel keratome. A continuous curvilinear capsulorrhexis was performed with a cystotome followed by the capsulorrhexis forceps. Hydrodissection and hydrodelineation were carried out with BSS on a blunt cannula. The lens was removed in a stop and chop technique and the remaining cortical material was removed with the irrigation-aspiration handpiece. The eye was inflated with viscoelastic and the DFT lens was placed in the eye and rotated to within a few degrees of the predetermined orientation.  The remaining viscoelastic was removed from the eye.  The Sinskey hook was used to rotate the toric lens into its final resting place at157 degrees.  0.1 ml of Vigamox was placed in the anterior chamber. The eye was inflated to a physiologic pressure and found to be watertight.   The eye was dressed with Vigamox and [place] The patient was given protective glasses to wear throughout the day and a shield with which to sleep tonight. The patient was also given drops with which to begin a drop regimen today and will follow-up with me in one day. Implant Name Type Inv. Item Serial No. Manufacturer Lot No. LRB No. Used Action  LENS IOL ACRYSOF VIVITY 21.0 - [id number]  LENS IOL ACRYSOF VIVITY 21.0 [id number] [employer]  Left 1 Implanted   Procedure(s) with comments: CATARACT EXTRACTION PHACO AND INTRAOCULAR LENS PLACEMENT (IOC) LEFT Vivity toric (Left) - 7.46 00:55.4  Electronically signed: Galen Manila 8/1/202310:30 AM

## 2021-08-29 NOTE — Addendum Note (Signed)
Addendum  created 08/29/21 1252 by Foye Deer, MD   Attestation recorded in Intraprocedure, Flowsheet accepted, Intraprocedure Attestations filed

## 2021-08-30 ENCOUNTER — Encounter: Payer: Self-pay | Admitting: Ophthalmology

## 2023-03-13 DIAGNOSIS — J309 Allergic rhinitis, unspecified: Secondary | ICD-10-CM | POA: Diagnosis not present

## 2023-03-28 DIAGNOSIS — H26491 Other secondary cataract, right eye: Secondary | ICD-10-CM | POA: Diagnosis not present

## 2023-03-28 DIAGNOSIS — H43813 Vitreous degeneration, bilateral: Secondary | ICD-10-CM | POA: Diagnosis not present

## 2023-04-17 DIAGNOSIS — J309 Allergic rhinitis, unspecified: Secondary | ICD-10-CM | POA: Diagnosis not present

## 2023-04-17 DIAGNOSIS — J45909 Unspecified asthma, uncomplicated: Secondary | ICD-10-CM | POA: Diagnosis not present

## 2023-04-17 DIAGNOSIS — M1990 Unspecified osteoarthritis, unspecified site: Secondary | ICD-10-CM | POA: Diagnosis not present

## 2023-04-17 DIAGNOSIS — N400 Enlarged prostate without lower urinary tract symptoms: Secondary | ICD-10-CM | POA: Diagnosis not present

## 2023-04-17 DIAGNOSIS — E663 Overweight: Secondary | ICD-10-CM | POA: Diagnosis not present

## 2023-06-07 ENCOUNTER — Other Ambulatory Visit: Payer: Self-pay

## 2023-06-07 ENCOUNTER — Emergency Department
Admission: EM | Admit: 2023-06-07 | Discharge: 2023-06-07 | Disposition: A | Discharge status: Home / Self Care | Attending: Emergency Medicine | Admitting: Emergency Medicine

## 2023-06-07 ENCOUNTER — Emergency Department

## 2023-06-07 DIAGNOSIS — N281 Cyst of kidney, acquired: Secondary | ICD-10-CM | POA: Diagnosis not present

## 2023-06-07 DIAGNOSIS — J45909 Unspecified asthma, uncomplicated: Secondary | ICD-10-CM | POA: Diagnosis not present

## 2023-06-07 DIAGNOSIS — R10813 Right lower quadrant abdominal tenderness: Secondary | ICD-10-CM | POA: Diagnosis not present

## 2023-06-07 DIAGNOSIS — R1084 Generalized abdominal pain: Secondary | ICD-10-CM | POA: Diagnosis not present

## 2023-06-07 DIAGNOSIS — R1031 Right lower quadrant pain: Secondary | ICD-10-CM | POA: Diagnosis not present

## 2023-06-07 DIAGNOSIS — R1033 Periumbilical pain: Secondary | ICD-10-CM | POA: Diagnosis not present

## 2023-06-07 LAB — COMPREHENSIVE METABOLIC PANEL WITH GFR
ALT: 23 U/L (ref 0–44)
AST: 21 U/L (ref 15–41)
Albumin: 4.1 g/dL (ref 3.5–5.0)
Alkaline Phosphatase: 65 U/L (ref 38–126)
Anion gap: 7 (ref 5–15)
BUN: 24 mg/dL — ABNORMAL HIGH (ref 8–23)
CO2: 24 mmol/L (ref 22–32)
Calcium: 9.4 mg/dL (ref 8.9–10.3)
Chloride: 106 mmol/L (ref 98–111)
Creatinine, Ser: 0.95 mg/dL (ref 0.61–1.24)
GFR, Estimated: >60 mL/min (ref >60)
Glucose, Bld: 102 mg/dL — ABNORMAL HIGH (ref 70–99)
Potassium: 4.1 mmol/L (ref 3.5–5.1)
Sodium: 137 mmol/L (ref 135–145)
Total Bilirubin: 0.5 mg/dL (ref 0.0–1.2)
Total Protein: 7.1 g/dL (ref 6.5–8.1)

## 2023-06-07 LAB — CBC
HCT: 44.5 % (ref 39.0–52.0)
Hemoglobin: 14.5 g/dL (ref 13.0–17.0)
MCH: 29.4 pg (ref 26.0–34.0)
MCHC: 32.6 g/dL (ref 30.0–36.0)
MCV: 90.1 fL (ref 80.0–100.0)
Platelets: 218 10*3/uL (ref 150–400)
RBC: 4.94 MIL/uL (ref 4.22–5.81)
RDW: 12.9 % (ref 11.5–15.5)
WBC: 5.8 10*3/uL (ref 4.0–10.5)
nRBC: 0 % (ref 0.0–0.2)

## 2023-06-07 LAB — LIPASE, BLOOD: Lipase: 37 U/L (ref 11–51)

## 2023-06-07 MED ORDER — DICYCLOMINE HCL 10 MG PO CAPS: 10.0000 mg | ORAL_CAPSULE | Freq: Three times a day (TID) | ORAL | 0 refills | Status: AC | PRN

## 2023-06-07 MED ORDER — IOHEXOL 300 MG/ML  SOLN
100.0000 mL | Freq: Once | INTRAMUSCULAR | Status: AC | PRN
  Administered 2023-06-07: 100 mL via INTRAVENOUS

## 2023-06-07 NOTE — ED Provider Notes (Signed)
 [facility] Provider Note    Event Date/Time   First MD Initiated Contact with Patient 06/07/23 1030     (approximate)   History   Abdominal Pain   HPI  [name] is a 73 y.o. male with a history of GERD, asthma, and osteoarthritis who presents with abdominal pain over the last week, periumbilical and lower abdomen, intermittent course, moderate intensity, and associated with some nausea and intermittent diarrhea.  He denies any urinary symptoms.  He has no fever or chills.  Denies any prior history of this pain.  He states sometimes it is better after he eats and sometimes eating seems to make it worse.  I reviewed the past medical records.  The patient's most recent outpatient encounter was on 12/20 of last year for follow-up with orthopedics after knee arthroplasty several years ago.  I also reviewed the UA from [facility] clinic from earlier today which is negative for acute findings.   Physical Exam   Triage Vital Signs: ED Triage Vitals  Encounter Vitals Group     BP 06/07/23 0940 138/85     Systolic BP Percentile --      Diastolic BP Percentile --      Pulse Rate 06/07/23 0940 (!) 59     Resp 06/07/23 0940 19     Temp 06/07/23 0940 97.8 F (36.6 C)     Temp Source 06/07/23 0940 Oral     SpO2 06/07/23 0940 96 %     Weight 06/07/23 0940 168 lb (76.2 kg)     Height 06/07/23 0940 5\' 8"  (1.727 m)     Head Circumference --      Peak Flow --      Pain Score 06/07/23 0944 4     Pain Loc --      Pain Education --      Exclude from Growth Chart --     Most recent vital signs: Vitals:   06/07/23 0940  BP: 138/85  Pulse: (!) 59  Resp: 19  Temp: 97.8 F (36.6 C)  SpO2: 96%     General: Awake, no distress.  CV:  Good peripheral perfusion.  Resp:  Normal effort.  Abd:  Soft with mild periumbilical discomfort.  No distention.  Other:  No jaundice or scleral icterus.   ED Results / Procedures / Treatments   Labs (all labs ordered  are listed, but only abnormal results are displayed) Labs Reviewed  COMPREHENSIVE METABOLIC PANEL WITH GFR - Abnormal; Notable for the following components:      Result Value   Glucose, Bld 102 (*)    BUN 24 (*)    All other components within normal limits  LIPASE, BLOOD  CBC     EKG     RADIOLOGY  CT ab/pelvis: I independently viewed and interpreted the images; there are no dilated bowel loops or any free air or free fluid.  Radiology report indicates the following:  IMPRESSION:  No acute abnormality seen in the abdomen or pelvis.     PROCEDURES:  Critical Care performed: No  Procedures   MEDICATIONS ORDERED IN ED: Medications  iohexol (OMNIPAQUE) 300 MG/ML solution 100 mL (100 mLs Intravenous Contrast Given 06/07/23 1110)     IMPRESSION / MDM / ASSESSMENT AND PLAN / ED COURSE  I reviewed the triage vital signs and the nursing notes.  74 year old male with PMH as noted above presents with somewhat nonlocalized lower and mid abdominal pain over the last week.  On exam the vital signs are normal and the patient is overall well-appearing.  He has minimal pain at this time.  He has mild periumbilical tenderness.  Differential diagnosis includes, but is not limited to, diverticulitis, colitis, UTI/pyelonephritis, enteritis, mesenteric adenitis, less likely SBO, volvulus, gastritis.  CMP shows no acute findings.  CBC shows no leukocytosis.  Lipase is normal.  UA from [place] clinic today is negative.  We will obtain a CT for further evaluation.  Patient's presentation is most consistent with acute presentation with potential threat to life or bodily function.  The patient is on the cardiac monitor to evaluate for evidence of arrhythmia and/or significant heart rate changes.   ----------------------------------------- 12:54 PM on 06/07/2023 -----------------------------------------  CT is negative for acute findings.  The patient remains comfortable appearing with no  active pain at this time.  He is stable for discharge home.  I counseled him on the results of the workup.  I have prescribed Bentyl for symptomatic treatment.  I gave strict return precautions, and he expresses understanding.   FINAL CLINICAL IMPRESSION(S) / ED DIAGNOSES   Final diagnoses:  Generalized abdominal pain     Rx / DC Orders   ED Discharge Orders          Ordered    dicyclomine (BENTYL) 10 MG capsule  Every 8 hours PRN        06/07/23 1252             Note:  This document was prepared using Dragon voice recognition software and may include unintentional dictation errors.    Lind Repine, MD 06/07/23 1254

## 2023-06-07 NOTE — ED Triage Notes (Addendum)
 Pt to ED via POV from home. Pt reports mid abdominal pain x1 wk. Pt reports a few episodes of diarrhea. Urine at [facility] negative

## 2023-06-07 NOTE — ED Notes (Signed)
 First Nurse Note: Pt to ED via [facility] for RLQ abdominal pain. Per [facility] staff pts abdomen is tender to touch and he has had diarrhea on and off for the past week. Pt reporting green stool, pt denies N/V.

## 2023-06-07 NOTE — Discharge Instructions (Signed)
 You may take the Bentyl as needed.  This decreases cramping and spasms in the intestines and may help with your pain.  Your CT scan did not show any signs of diverticulitis or any other acute infection.  Follow-up with your primary care provider.  In the meantime, return to the ER immediately for new, worsening, or persistent severe abdominal pain, fever, vomiting, or any other new or worsening symptoms that concerning.

## 2023-06-11 DIAGNOSIS — R197 Diarrhea, unspecified: Secondary | ICD-10-CM | POA: Diagnosis not present

## 2023-06-11 DIAGNOSIS — R110 Nausea: Secondary | ICD-10-CM | POA: Diagnosis not present

## 2023-06-12 DIAGNOSIS — R197 Diarrhea, unspecified: Secondary | ICD-10-CM | POA: Diagnosis not present

## 2023-07-15 DIAGNOSIS — Z79899 Other long term (current) drug therapy: Secondary | ICD-10-CM | POA: Diagnosis not present

## 2023-07-15 DIAGNOSIS — N411 Chronic prostatitis: Secondary | ICD-10-CM | POA: Diagnosis not present

## 2023-07-15 DIAGNOSIS — R339 Retention of urine, unspecified: Secondary | ICD-10-CM | POA: Diagnosis not present

## 2023-07-15 DIAGNOSIS — Z125 Encounter for screening for malignant neoplasm of prostate: Secondary | ICD-10-CM | POA: Diagnosis not present

## 2023-08-12 DIAGNOSIS — R0789 Other chest pain: Secondary | ICD-10-CM | POA: Diagnosis not present

## 2023-08-12 DIAGNOSIS — R12 Heartburn: Secondary | ICD-10-CM | POA: Diagnosis not present

## 2023-08-30 DIAGNOSIS — R0789 Other chest pain: Secondary | ICD-10-CM | POA: Diagnosis not present

## 2023-09-20 ENCOUNTER — Other Ambulatory Visit: Payer: Self-pay | Admitting: Cardiovascular Disease

## 2023-09-20 DIAGNOSIS — R931 Abnormal findings on diagnostic imaging of heart and coronary circulation: Secondary | ICD-10-CM | POA: Diagnosis not present

## 2023-09-20 DIAGNOSIS — R9431 Abnormal electrocardiogram [ECG] [EKG]: Secondary | ICD-10-CM | POA: Diagnosis not present

## 2023-09-20 DIAGNOSIS — R9439 Abnormal result of other cardiovascular function study: Secondary | ICD-10-CM

## 2023-09-20 DIAGNOSIS — R7309 Other abnormal glucose: Secondary | ICD-10-CM | POA: Diagnosis not present

## 2023-09-20 DIAGNOSIS — R9430 Abnormal result of cardiovascular function study, unspecified: Secondary | ICD-10-CM

## 2023-09-24 DIAGNOSIS — K219 Gastro-esophageal reflux disease without esophagitis: Secondary | ICD-10-CM | POA: Diagnosis not present

## 2023-09-24 DIAGNOSIS — Z1331 Encounter for screening for depression: Secondary | ICD-10-CM | POA: Diagnosis not present

## 2023-09-24 DIAGNOSIS — J4530 Mild persistent asthma, uncomplicated: Secondary | ICD-10-CM | POA: Diagnosis not present

## 2023-09-24 DIAGNOSIS — R799 Abnormal finding of blood chemistry, unspecified: Secondary | ICD-10-CM | POA: Diagnosis not present

## 2023-09-24 DIAGNOSIS — Z0000 Encounter for general adult medical examination without abnormal findings: Secondary | ICD-10-CM | POA: Diagnosis not present

## 2023-09-24 DIAGNOSIS — E7800 Pure hypercholesterolemia, unspecified: Secondary | ICD-10-CM | POA: Diagnosis not present

## 2023-09-24 DIAGNOSIS — R7303 Prediabetes: Secondary | ICD-10-CM | POA: Diagnosis not present

## 2023-10-11 ENCOUNTER — Telehealth (HOSPITAL_COMMUNITY): Payer: Self-pay | Admitting: Emergency Medicine

## 2023-10-11 NOTE — Telephone Encounter (Signed)
 Reaching out to patient to offer assistance regarding upcoming cardiac imaging study; pt verbalizes understanding of appt date/time, parking situation and where to check in, pre-test NPO status and medications ordered, and verified current allergies; name and call back number provided for further questions should they arise Rockwell Alexandria RN Navigator Cardiac Imaging [facility] [phone] office [phone] cell

## 2023-10-14 ENCOUNTER — Ambulatory Visit
Admission: RE | Admit: 2023-10-14 | Discharge: 2023-10-14 | Disposition: A | Discharge status: Home / Self Care | Source: Ambulatory Visit | Attending: Cardiovascular Disease | Admitting: Cardiovascular Disease

## 2023-10-14 DIAGNOSIS — R9439 Abnormal result of other cardiovascular function study: Secondary | ICD-10-CM | POA: Insufficient documentation

## 2023-10-14 DIAGNOSIS — R9430 Abnormal result of cardiovascular function study, unspecified: Secondary | ICD-10-CM | POA: Diagnosis not present

## 2023-10-14 MED ORDER — METOPROLOL TARTRATE 5 MG/5ML IV SOLN: 10.0000 mg | INTRAVENOUS | Status: DC | PRN

## 2023-10-14 MED ORDER — NITROGLYCERIN 0.4 MG SL SUBL
0.8000 mg | SUBLINGUAL_TABLET | Freq: Once | SUBLINGUAL | Status: AC
  Administered 2023-10-14: 0.8 mg via SUBLINGUAL
  Filled 2023-10-14: qty 25

## 2023-10-14 MED ORDER — IOHEXOL 350 MG/ML SOLN
100.0000 mL | Freq: Once | INTRAVENOUS | Status: AC | PRN
  Administered 2023-10-14: 100 mL via INTRAVENOUS

## 2023-10-14 MED ORDER — DILTIAZEM HCL 25 MG/5ML IV SOLN: 10.0000 mg | INTRAVENOUS | Status: DC | PRN

## 2023-10-14 NOTE — Progress Notes (Signed)
 Patient tolerated procedure well. W/C to lobby.  Ambulate w/o difficulty. Denies light headedness or being dizzy. Encouraged to drink extra water today and reasoning explained. Verbalized understanding. All questions answered. ABC intact. No further needs. Discharge from procedure area w/o issues.

## 2023-12-24 DIAGNOSIS — D0462 Carcinoma in situ of skin of left upper limb, including shoulder: Secondary | ICD-10-CM | POA: Diagnosis not present

## 2023-12-24 DIAGNOSIS — D485 Neoplasm of uncertain behavior of skin: Secondary | ICD-10-CM | POA: Diagnosis not present

## 2023-12-24 DIAGNOSIS — D2262 Melanocytic nevi of left upper limb, including shoulder: Secondary | ICD-10-CM | POA: Diagnosis not present

## 2023-12-24 DIAGNOSIS — M79605 Pain in left leg: Secondary | ICD-10-CM | POA: Diagnosis not present

## 2023-12-24 DIAGNOSIS — D2261 Melanocytic nevi of right upper limb, including shoulder: Secondary | ICD-10-CM | POA: Diagnosis not present

## 2023-12-24 DIAGNOSIS — D225 Melanocytic nevi of trunk: Secondary | ICD-10-CM | POA: Diagnosis not present

## 2023-12-24 DIAGNOSIS — S8992XA Unspecified injury of left lower leg, initial encounter: Secondary | ICD-10-CM | POA: Diagnosis not present

## 2023-12-24 DIAGNOSIS — D2271 Melanocytic nevi of right lower limb, including hip: Secondary | ICD-10-CM | POA: Diagnosis not present

## 2023-12-24 DIAGNOSIS — D2272 Melanocytic nevi of left lower limb, including hip: Secondary | ICD-10-CM | POA: Diagnosis not present

## 2023-12-24 DIAGNOSIS — L570 Actinic keratosis: Secondary | ICD-10-CM | POA: Diagnosis not present

## 2023-12-24 DIAGNOSIS — D2372 Other benign neoplasm of skin of left lower limb, including hip: Secondary | ICD-10-CM | POA: Diagnosis not present

## 2023-12-24 DIAGNOSIS — L821 Other seborrheic keratosis: Secondary | ICD-10-CM | POA: Diagnosis not present
